# Patient Record
Sex: Male | Born: 1990 | Race: White | Hispanic: No | Marital: Single | Smoking: Never smoker
Health system: Southern US, Community
[De-identification: ages and names within clinical notes are randomized; demographics above are authoritative.]

## PROBLEM LIST (undated history)

## (undated) DIAGNOSIS — L0390 Cellulitis, unspecified: Secondary | ICD-10-CM

---
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
## 2014-04-13 ENCOUNTER — Emergency Department: Payer: Self-pay | Admitting: Emergency Medicine

## 2014-06-12 ENCOUNTER — Emergency Department: Payer: Self-pay | Admitting: Student

## 2015-05-21 ENCOUNTER — Encounter: Payer: Self-pay | Admitting: Emergency Medicine

## 2015-05-21 ENCOUNTER — Emergency Department
Admission: EM | Admit: 2015-05-21 | Discharge: 2015-05-21 | Disposition: A | Discharge status: Home / Self Care | Payer: 59 | Attending: Emergency Medicine | Admitting: Emergency Medicine

## 2015-05-21 DIAGNOSIS — F419 Anxiety disorder, unspecified: Secondary | ICD-10-CM | POA: Insufficient documentation

## 2015-05-21 DIAGNOSIS — F17200 Nicotine dependence, unspecified, uncomplicated: Secondary | ICD-10-CM | POA: Diagnosis not present

## 2015-05-21 DIAGNOSIS — F1020 Alcohol dependence, uncomplicated: Secondary | ICD-10-CM | POA: Insufficient documentation

## 2015-05-21 DIAGNOSIS — R000 Tachycardia, unspecified: Secondary | ICD-10-CM | POA: Insufficient documentation

## 2015-05-21 DIAGNOSIS — F1010 Alcohol abuse, uncomplicated: Secondary | ICD-10-CM

## 2015-05-21 LAB — CBC WITH DIFFERENTIAL/PLATELET
BASOS ABS: 0 10*3/uL (ref 0–0.1)
BASOS PCT: 0 %
Eosinophils Absolute: 0.4 10*3/uL (ref 0–0.7)
Eosinophils Relative: 6 %
HEMATOCRIT: 47 % (ref 40.0–52.0)
HEMOGLOBIN: 16.6 g/dL (ref 13.0–18.0)
Lymphocytes Relative: 35 %
Lymphs Abs: 2.6 10*3/uL (ref 1.0–3.6)
MCH: 33.4 pg (ref 26.0–34.0)
MCHC: 35.4 g/dL (ref 32.0–36.0)
MCV: 94.3 fL (ref 80.0–100.0)
Monocytes Absolute: 0.7 10*3/uL (ref 0.2–1.0)
Monocytes Relative: 10 %
NEUTROS ABS: 3.6 10*3/uL (ref 1.4–6.5)
NEUTROS PCT: 49 %
Platelets: 151 10*3/uL (ref 150–440)
RBC: 4.99 MIL/uL (ref 4.40–5.90)
RDW: 12.8 % (ref 11.5–14.5)
WBC: 7.4 10*3/uL (ref 3.8–10.6)

## 2015-05-21 LAB — COMPREHENSIVE METABOLIC PANEL
ALT: 9 U/L — ABNORMAL LOW (ref 17–63)
ANION GAP: 8 (ref 5–15)
AST: 23 U/L (ref 15–41)
Albumin: 4.3 g/dL (ref 3.5–5.0)
Alkaline Phosphatase: 108 U/L (ref 38–126)
BUN: 9 mg/dL (ref 6–20)
CO2: 27 mmol/L (ref 22–32)
Calcium: 8.7 mg/dL — ABNORMAL LOW (ref 8.9–10.3)
Chloride: 105 mmol/L (ref 101–111)
Creatinine, Ser: 0.66 mg/dL (ref 0.61–1.24)
GFR calc non Af Amer: >60 mL/min (ref >60)
Glucose, Bld: 99 mg/dL (ref 65–99)
Potassium: 3.6 mmol/L (ref 3.5–5.1)
Sodium: 140 mmol/L (ref 135–145)
Total Bilirubin: 0.7 mg/dL (ref 0.3–1.2)
Total Protein: 7.3 g/dL (ref 6.5–8.1)

## 2015-05-21 LAB — ACETAMINOPHEN LEVEL: Acetaminophen (Tylenol), Serum: <10 ug/mL — ABNORMAL LOW (ref 10–30)

## 2015-05-21 LAB — URINE DRUG SCREEN, QUALITATIVE (ARMC ONLY)
AMPHETAMINES, UR SCREEN: NOT DETECTED
BARBITURATES, UR SCREEN: NOT DETECTED
BENZODIAZEPINE, UR SCRN: NOT DETECTED
Cannabinoid 50 Ng, Ur ~~LOC~~: NOT DETECTED
Cocaine Metabolite,Ur ~~LOC~~: NOT DETECTED
MDMA (Ecstasy)Ur Screen: NOT DETECTED
METHADONE SCREEN, URINE: NOT DETECTED
Opiate, Ur Screen: NOT DETECTED
PHENCYCLIDINE (PCP) UR S: NOT DETECTED
Tricyclic, Ur Screen: NOT DETECTED

## 2015-05-21 LAB — SALICYLATE LEVEL

## 2015-05-21 LAB — ETHANOL: Alcohol, Ethyl (B): 263 mg/dL — ABNORMAL HIGH (ref <5)

## 2015-05-21 MED ORDER — LORAZEPAM 2 MG PO TABS: 0.0000 mg | ORAL_TABLET | Freq: Two times a day (BID) | ORAL | Status: DC

## 2015-05-21 MED ORDER — LORAZEPAM 2 MG PO TABS: 0.0000 mg | ORAL_TABLET | Freq: Four times a day (QID) | ORAL | Status: DC

## 2015-05-21 MED ORDER — THIAMINE HCL 100 MG/ML IJ SOLN
100.0000 mg | Freq: Every day | INTRAMUSCULAR | Status: DC
Start: 2015-05-21 — End: 2015-05-21

## 2015-05-21 MED ORDER — VITAMIN B-1 100 MG PO TABS
100.0000 mg | ORAL_TABLET | Freq: Every day | ORAL | Status: DC
  Filled 2015-05-21 (×2): qty 1

## 2015-05-21 MED ORDER — LORAZEPAM 2 MG/ML IJ SOLN: 0.0000 mg | Freq: Four times a day (QID) | INTRAMUSCULAR | Status: DC

## 2015-05-21 MED ORDER — LORAZEPAM 2 MG/ML IJ SOLN: 0.0000 mg | Freq: Two times a day (BID) | INTRAMUSCULAR | Status: DC

## 2015-05-21 MED ORDER — NICOTINE 14 MG/24HR TD PT24
14.0000 mg | MEDICATED_PATCH | Freq: Every day | TRANSDERMAL | Status: DC
  Filled 2015-05-21 (×2): qty 1

## 2015-05-21 MED ORDER — LORAZEPAM 1 MG PO TABS: 1.0000 mg | ORAL_TABLET | ORAL | Status: DC

## 2015-05-21 NOTE — ED Notes (Signed)
Pt brought by friends for anxiety and stress.  Denies si hi.  Denies benig depressed.

## 2015-05-21 NOTE — ED Notes (Signed)
Pt given breakfast tray, sprite and warm blanket. Pt is relaxed and watching TV.

## 2015-05-21 NOTE — ED Provider Notes (Signed)
[facility] Emergency Department Provider Note  ____________________________________________  Time seen: Approximately 10:32 AM  I have reviewed the triage vital signs and the nursing notes.   HISTORY  Chief Complaint Anxiety    HPI Mark Keller is a 25 y.o. male for evaluation of anxiety and alcohol use.  Patient reports friends encouraged himto calm for evaluation as he is been drinking excessive alcohol. He reports he has alcoholism, and this runs in his family. Reports he drinks about 12-18 beers per day and will get the shakes if he stops drinking. He's never had any hallucinations or seizures. He denies any suicidal ideation, thoughts of self-harm, or wanting to harm others. No hallucinations.  Denies being in pain. States his last alcohol was about an hour prior to arrival. [place] he is presently intoxicated. He is interested in obtaining detox treatment today.   History reviewed. No pertinent past medical history.  There are no active problems to display for this patient.   No past surgical history on file.  No current outpatient prescriptions on file.  Allergies Review of patient's allergies indicates no known allergies.  No family history on file.  Social History Social History  Substance Use Topics  . Smoking status: Current Every Day Smoker  . Smokeless tobacco: None  . Alcohol Use: Yes    Review of Systems Constitutional: No fever/chills Eyes: No visual changes. ENT: No sore throat. Cardiovascular: Denies chest pain. Respiratory: Denies shortness of breath. Gastrointestinal: No abdominal pain.  No nausea, no vomiting.  No diarrhea.  No constipation. Genitourinary: Negative for dysuria. Musculoskeletal: Negative for back pain. Skin: Negative for rash. Neurological: Negative for headaches, focal weakness or numbness.  10-point ROS otherwise negative.  ____________________________________________   PHYSICAL  EXAM:  VITAL SIGNS: ED Triage Vitals  Enc Vitals Group     BP 05/21/15 0754 142/88 mmHg     Pulse Rate 05/21/15 0754 127     Resp 05/21/15 0754 24     Temp 05/21/15 0754 98.1 F (36.7 C)     Temp Source 05/21/15 0754 Oral     SpO2 05/21/15 0754 100 %     Weight 05/21/15 0754 165 lb (74.844 kg)     Height 05/21/15 0754  (1.778 m)     Head Cir --      Peak Flow --      Pain Score --      Pain Loc --      Pain Edu? --      Excl. in GC? --    Constitutional: Alert and oriented. Well appearing and in no acute distress. Eyes: Conjunctivae are normal. PERRL. EOMI. Mild lateral gaze nystagmus. Head: Atraumatic. Nose: No congestion/rhinnorhea. Mouth/Throat: Mucous membranes are slightly dry.  Oropharynx non-erythematous. Neck: No stridor.   Cardiovascular: Slightly tachycardic rate, regular rhythm. Grossly normal heart sounds.  Good peripheral circulation. Respiratory: Normal respiratory effort.  No retractions. Lungs CTAB. Gastrointestinal: Soft and nontender. No distention. No abdominal bruits. No CVA tenderness. Musculoskeletal: No lower extremity tenderness nor edema.  No joint effusions. Neurologic:  Normal speech and language. No gross focal neurologic deficits are appreciated. Skin:  Skin is warm, dry and intact. No rash noted. Psychiatric: Mood and affect are slightly anxious. Speech and behavior are normal.  ____________________________________________   LABS (all labs ordered are listed, but only abnormal results are displayed)  Labs Reviewed  COMPREHENSIVE METABOLIC PANEL - Abnormal; Notable for the following:    Calcium 8.7 (*)    ALT  9 (*)    All other components within normal limits  ETHANOL - Abnormal; Notable for the following:    Alcohol, Ethyl (B) 263 (*)    All other components within normal limits  ACETAMINOPHEN LEVEL - Abnormal; Notable for the following:    Acetaminophen (Tylenol), Serum <10 (*)    All other components within normal limits  CBC  WITH DIFFERENTIAL/PLATELET  URINE DRUG SCREEN, QUALITATIVE ([facility] ONLY)  SALICYLATE LEVEL   ____________________________________________  EKG   ____________________________________________  RADIOLOGY   ____________________________________________   PROCEDURES  Procedure(s) performed: None  Critical Care performed: No  ____________________________________________   INITIAL IMPRESSION / ASSESSMENT AND PLAN / ED COURSE  Pertinent labs & imaging results that were available during my care of the patient were reviewed by me and considered in my medical decision making (see chart for details).  Patient percents for concerns of alcohol abuse and anxiety. He reports long history of chronic alcohol use. He is requesting detox today. No signs or symptoms to suggest need for involuntary commitment, suicidal ideation, or risk for harming others. I will place him on alcohol withdrawal treatment and consult TTS.  ----------------------------------------- 11:34 AM on 05/21/2015 -----------------------------------------  The patient reports that he no longer wishes to stay for detox. He is awake alert, ambulatory and does not demonstrate clinical evidence of intoxication. His blood alcohol level was 263 about 2 hours ago. Instructed patient he will not be driving today or while using alcohol. He has a friend coming to pick him up, and plan is for him to follow up with [facility] which she was given referral information for.  Careful return precautions discussed with patient.  Discharging pending arrival of sober driver.  Return precautions and treatment recommendations and follow-up discussed with the patient who is agreeable with the plan.  ____________________________________________   FINAL CLINICAL IMPRESSION(S) / ED DIAGNOSES  Final diagnoses:  Alcohol abuse      Sharyn Creamer, MD 05/21/15 1135
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
## 2015-05-21 NOTE — BH Assessment (Signed)
Discussed patient with ER MD (Dr. Fanny Bien) and patient is able to discharge home when medically cleared. Patient was giving referral information and instructions on how to follow up with Outpatient Treatment [employer]) and [facility].  I advised the patient to call the toll free phone on his insurance card for the counselors and agencies in his network.
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
## 2022-09-28 ENCOUNTER — Emergency Department (HOSPITAL_COMMUNITY): Payer: Medicaid Other

## 2022-09-28 ENCOUNTER — Emergency Department (HOSPITAL_COMMUNITY): Payer: Medicaid Other | Admitting: Certified Registered Nurse Anesthetist

## 2022-09-28 ENCOUNTER — Encounter (HOSPITAL_COMMUNITY)
Admission: EM | Disposition: A | Discharge status: Home / Self Care | Payer: Self-pay | Source: Home / Self Care | Attending: Vascular Surgery

## 2022-09-28 ENCOUNTER — Inpatient Hospital Stay (HOSPITAL_COMMUNITY)
Admission: EM | Admit: 2022-09-28 | Discharge: 2022-10-01 | DRG: 908 | Disposition: A | Discharge status: Home / Self Care | Payer: Medicaid Other | Attending: Vascular Surgery | Admitting: Vascular Surgery

## 2022-09-28 ENCOUNTER — Other Ambulatory Visit: Payer: Self-pay

## 2022-09-28 ENCOUNTER — Encounter (HOSPITAL_COMMUNITY): Payer: Self-pay

## 2022-09-28 DIAGNOSIS — Z9889 Other specified postprocedural states: Secondary | ICD-10-CM

## 2022-09-28 DIAGNOSIS — W3400XA Accidental discharge from unspecified firearms or gun, initial encounter: Principal | ICD-10-CM

## 2022-09-28 DIAGNOSIS — D62 Acute posthemorrhagic anemia: Secondary | ICD-10-CM | POA: Diagnosis not present

## 2022-09-28 DIAGNOSIS — W3409XA Accidental discharge from other specified firearms, initial encounter: Secondary | ICD-10-CM | POA: Diagnosis not present

## 2022-09-28 DIAGNOSIS — S81802A Unspecified open wound, left lower leg, initial encounter: Secondary | ICD-10-CM | POA: Diagnosis not present

## 2022-09-28 DIAGNOSIS — S75092A Other specified injury of femoral artery, left leg, initial encounter: Secondary | ICD-10-CM

## 2022-09-28 DIAGNOSIS — R450 Nervousness: Secondary | ICD-10-CM | POA: Diagnosis not present

## 2022-09-28 DIAGNOSIS — Z743 Need for continuous supervision: Secondary | ICD-10-CM | POA: Diagnosis not present

## 2022-09-28 DIAGNOSIS — I743 Embolism and thrombosis of arteries of the lower extremities: Secondary | ICD-10-CM | POA: Diagnosis not present

## 2022-09-28 DIAGNOSIS — Z23 Encounter for immunization: Secondary | ICD-10-CM | POA: Diagnosis not present

## 2022-09-28 DIAGNOSIS — S75002A Unspecified injury of femoral artery, left leg, initial encounter: Secondary | ICD-10-CM | POA: Diagnosis not present

## 2022-09-28 DIAGNOSIS — S71132A Puncture wound without foreign body, left thigh, initial encounter: Secondary | ICD-10-CM | POA: Diagnosis not present

## 2022-09-28 DIAGNOSIS — S75022A Major laceration of femoral artery, left leg, initial encounter: Principal | ICD-10-CM | POA: Diagnosis present

## 2022-09-28 DIAGNOSIS — Z885 Allergy status to narcotic agent status: Secondary | ICD-10-CM | POA: Diagnosis not present

## 2022-09-28 DIAGNOSIS — I959 Hypotension, unspecified: Secondary | ICD-10-CM | POA: Diagnosis not present

## 2022-09-28 DIAGNOSIS — R42 Dizziness and giddiness: Secondary | ICD-10-CM | POA: Diagnosis not present

## 2022-09-28 DIAGNOSIS — Y249XXA Unspecified firearm discharge, undetermined intent, initial encounter: Secondary | ICD-10-CM

## 2022-09-28 DIAGNOSIS — Y9389 Activity, other specified: Secondary | ICD-10-CM

## 2022-09-28 DIAGNOSIS — E876 Hypokalemia: Secondary | ICD-10-CM | POA: Diagnosis present

## 2022-09-28 DIAGNOSIS — S71102A Unspecified open wound, left thigh, initial encounter: Secondary | ICD-10-CM | POA: Diagnosis not present

## 2022-09-28 HISTORY — PX: FEMORAL-POPLITEAL BYPASS GRAFT: SHX937

## 2022-09-28 HISTORY — PX: VEIN HARVEST: SHX6363

## 2022-09-28 HISTORY — PX: FASCIOTOMY: SHX132

## 2022-09-28 HISTORY — PX: PATCH ANGIOPLASTY: SHX6230

## 2022-09-28 LAB — CBC
HCT: 39.7 % (ref 39.0–52.0)
Hemoglobin: 13.2 g/dL (ref 13.0–17.0)
MCH: 31.1 pg (ref 26.0–34.0)
MCHC: 33.2 g/dL (ref 30.0–36.0)
MCV: 93.6 fL (ref 80.0–100.0)
Platelets: 230 10*3/uL (ref 150–400)
RBC: 4.24 MIL/uL (ref 4.22–5.81)
RDW: 12.9 % (ref 11.5–15.5)
WBC: 16.8 10*3/uL — ABNORMAL HIGH (ref 4.0–10.5)
nRBC: 0 % (ref 0.0–0.2)

## 2022-09-28 LAB — PROTIME-INR
INR: 1 (ref 0.8–1.2)
Prothrombin Time: 13.7 seconds (ref 11.4–15.2)

## 2022-09-28 LAB — I-STAT CHEM 8, ED
BUN: 19 mg/dL (ref 6–20)
Calcium, Ion: 1.11 mmol/L — ABNORMAL LOW (ref 1.15–1.40)
Chloride: 102 mmol/L (ref 98–111)
Creatinine, Ser: 1.1 mg/dL (ref 0.61–1.24)
Glucose, Bld: 180 mg/dL — ABNORMAL HIGH (ref 70–99)
HCT: 40 % (ref 39.0–52.0)
Hemoglobin: 13.6 g/dL (ref 13.0–17.0)
Potassium: 2.8 mmol/L — ABNORMAL LOW (ref 3.5–5.1)
Sodium: 138 mmol/L (ref 135–145)
TCO2: 20 mmol/L — ABNORMAL LOW (ref 22–32)

## 2022-09-28 LAB — POCT I-STAT 7, (LYTES, BLD GAS, ICA,H+H)
Acid-base deficit: 5 mmol/L — ABNORMAL HIGH (ref 0.0–2.0)
Acid-base deficit: 7 mmol/L — ABNORMAL HIGH (ref 0.0–2.0)
Bicarbonate: 21.8 mmol/L (ref 20.0–28.0)
Bicarbonate: 19.5 mmol/L — ABNORMAL LOW (ref 20.0–28.0)
Calcium, Ion: 1.1 mmol/L — ABNORMAL LOW (ref 1.15–1.40)
Calcium, Ion: 1.04 mmol/L — ABNORMAL LOW (ref 1.15–1.40)
HCT: 32 % — ABNORMAL LOW (ref 39.0–52.0)
HCT: 31 % — ABNORMAL LOW (ref 39.0–52.0)
Hemoglobin: 10.9 g/dL — ABNORMAL LOW (ref 13.0–17.0)
Hemoglobin: 10.5 g/dL — ABNORMAL LOW (ref 13.0–17.0)
O2 Saturation: 100 %
O2 Saturation: 100 %
Patient temperature: 35.3
Patient temperature: 35.3
Potassium: 4.1 mmol/L (ref 3.5–5.1)
Potassium: 4.5 mmol/L (ref 3.5–5.1)
Sodium: 137 mmol/L (ref 135–145)
Sodium: 138 mmol/L (ref 135–145)
TCO2: 23 mmol/L (ref 22–32)
TCO2: 21 mmol/L — ABNORMAL LOW (ref 22–32)
pCO2 arterial: 41.7 mmHg (ref 32–48)
pCO2 arterial: 37.9 mmHg (ref 32–48)
pH, Arterial: 7.318 — ABNORMAL LOW (ref 7.35–7.45)
pH, Arterial: 7.312 — ABNORMAL LOW (ref 7.35–7.45)
pO2, Arterial: 349 mmHg — ABNORMAL HIGH (ref 83–108)
pO2, Arterial: 333 mmHg — ABNORMAL HIGH (ref 83–108)

## 2022-09-28 LAB — COMPREHENSIVE METABOLIC PANEL
ALT: 12 U/L (ref 0–44)
AST: 23 U/L (ref 15–41)
Albumin: 3.9 g/dL (ref 3.5–5.0)
Alkaline Phosphatase: 59 U/L (ref 38–126)
Anion gap: 14 (ref 5–15)
BUN: 17 mg/dL (ref 6–20)
CO2: 19 mmol/L — ABNORMAL LOW (ref 22–32)
Calcium: 8.6 mg/dL — ABNORMAL LOW (ref 8.9–10.3)
Chloride: 100 mmol/L (ref 98–111)
Creatinine, Ser: 1.14 mg/dL (ref 0.61–1.24)
GFR, Estimated: >60 mL/min (ref >60)
Glucose, Bld: 188 mg/dL — ABNORMAL HIGH (ref 70–99)
Potassium: 2.7 mmol/L — CL (ref 3.5–5.1)
Sodium: 133 mmol/L — ABNORMAL LOW (ref 135–145)
Total Bilirubin: 0.4 mg/dL (ref 0.3–1.2)
Total Protein: 6.1 g/dL — ABNORMAL LOW (ref 6.5–8.1)

## 2022-09-28 LAB — POCT ACTIVATED CLOTTING TIME
Activated Clotting Time: 195 s
Activated Clotting Time: 207 s

## 2022-09-28 LAB — PREPARE RBC (CROSSMATCH)

## 2022-09-28 LAB — ETHANOL: Alcohol, Ethyl (B): <10 mg/dL (ref <10)

## 2022-09-28 LAB — LACTIC ACID, PLASMA: Lactic Acid, Venous: 5.4 mmol/L (ref 0.5–1.9)

## 2022-09-28 LAB — ABO/RH: ABO/RH(D): A POS

## 2022-09-28 SURGERY — BYPASS GRAFT FEMORAL-POPLITEAL ARTERY
Anesthesia: General | Site: Leg Upper | Laterality: Right

## 2022-09-28 MED ORDER — SUCCINYLCHOLINE CHLORIDE 200 MG/10ML IV SOSY
PREFILLED_SYRINGE | INTRAVENOUS | Status: DC | PRN
  Administered 2022-09-28: 160 mg via INTRAVENOUS

## 2022-09-28 MED ORDER — HEPARIN SODIUM (PORCINE) 5000 UNIT/ML IJ SOLN
5000.0000 [IU] | Freq: Three times a day (TID) | INTRAMUSCULAR | Status: DC
  Administered 2022-09-29 – 2022-10-01 (×6): 5000 [IU] via SUBCUTANEOUS
  Filled 2022-09-28 (×6): qty 1

## 2022-09-28 MED ORDER — DEXMEDETOMIDINE HCL IN NACL 80 MCG/20ML IV SOLN
INTRAVENOUS | Status: AC
  Filled 2022-09-28: qty 20

## 2022-09-28 MED ORDER — HYDROMORPHONE HCL 1 MG/ML IJ SOLN: 0.2500 mg | INTRAMUSCULAR | Status: DC | PRN

## 2022-09-28 MED ORDER — BISACODYL 5 MG PO TBEC: 5.0000 mg | DELAYED_RELEASE_TABLET | Freq: Every day | ORAL | Status: DC | PRN

## 2022-09-28 MED ORDER — HEMOSTATIC AGENTS (NO CHARGE) OPTIME
TOPICAL | Status: DC | PRN
  Administered 2022-09-28 (×2): 1 via TOPICAL

## 2022-09-28 MED ORDER — LACTATED RINGERS IV BOLUS
1000.0000 mL | Freq: Once | INTRAVENOUS | Status: AC
  Administered 2022-09-28: 1000 mL via INTRAVENOUS

## 2022-09-28 MED ORDER — POTASSIUM CHLORIDE CRYS ER 20 MEQ PO TBCR: 20.0000 meq | EXTENDED_RELEASE_TABLET | Freq: Every day | ORAL | Status: DC | PRN

## 2022-09-28 MED ORDER — PANTOPRAZOLE SODIUM 40 MG PO TBEC
40.0000 mg | DELAYED_RELEASE_TABLET | Freq: Every day | ORAL | Status: DC
  Administered 2022-09-28 – 2022-10-01 (×3): 40 mg via ORAL
  Filled 2022-09-28 (×3): qty 1

## 2022-09-28 MED ORDER — PHENOL 1.4 % MT LIQD: 1.0000 | OROMUCOSAL | Status: DC | PRN

## 2022-09-28 MED ORDER — CEFAZOLIN SODIUM 1 G IJ SOLR
INTRAMUSCULAR | Status: AC
  Filled 2022-09-28: qty 20

## 2022-09-28 MED ORDER — PHENYLEPHRINE HCL-NACL 20-0.9 MG/250ML-% IV SOLN
INTRAVENOUS | Status: DC | PRN
  Administered 2022-09-28: 60 ug/min via INTRAVENOUS

## 2022-09-28 MED ORDER — HYDROMORPHONE HCL 1 MG/ML IJ SOLN
1.0000 mg | INTRAMUSCULAR | Status: DC | PRN
  Administered 2022-09-28 – 2022-10-01 (×2): 1 mg via INTRAVENOUS
  Filled 2022-09-28 (×2): qty 1

## 2022-09-28 MED ORDER — 0.9 % SODIUM CHLORIDE (POUR BTL) OPTIME
TOPICAL | Status: DC | PRN
  Administered 2022-09-28: 2000 mL

## 2022-09-28 MED ORDER — HEPARIN SODIUM (PORCINE) 1000 UNIT/ML IJ SOLN
INTRAMUSCULAR | Status: DC | PRN
  Administered 2022-09-28: 7000 [IU] via INTRAVENOUS
  Administered 2022-09-28: 2000 [IU] via INTRAVENOUS

## 2022-09-28 MED ORDER — SODIUM CHLORIDE 0.9 % IV SOLN: INTRAVENOUS | Status: DC | PRN

## 2022-09-28 MED ORDER — PROTAMINE SULFATE 10 MG/ML IV SOLN
INTRAVENOUS | Status: AC
  Filled 2022-09-28: qty 10

## 2022-09-28 MED ORDER — SODIUM CHLORIDE 0.9 % IV SOLN: 500.0000 mL | Freq: Once | INTRAVENOUS | Status: DC | PRN

## 2022-09-28 MED ORDER — LIDOCAINE 2% (20 MG/ML) 5 ML SYRINGE
INTRAMUSCULAR | Status: AC
  Filled 2022-09-28: qty 5

## 2022-09-28 MED ORDER — ONDANSETRON HCL 4 MG/2ML IJ SOLN
4.0000 mg | Freq: Four times a day (QID) | INTRAMUSCULAR | Status: DC | PRN
  Administered 2022-09-28 – 2022-10-01 (×3): 4 mg via INTRAVENOUS
  Filled 2022-09-28 (×2): qty 2

## 2022-09-28 MED ORDER — DEXMEDETOMIDINE HCL IN NACL 80 MCG/20ML IV SOLN
INTRAVENOUS | Status: DC | PRN
  Administered 2022-09-28: 4 ug via INTRAVENOUS
  Administered 2022-09-28: 8 ug via INTRAVENOUS

## 2022-09-28 MED ORDER — AMISULPRIDE (ANTIEMETIC) 5 MG/2ML IV SOLN
INTRAVENOUS | Status: AC
  Filled 2022-09-28: qty 4

## 2022-09-28 MED ORDER — ASPIRIN 81 MG PO TBEC
81.0000 mg | DELAYED_RELEASE_TABLET | Freq: Every day | ORAL | Status: DC
  Administered 2022-09-29 – 2022-10-01 (×2): 81 mg via ORAL
  Filled 2022-09-28 (×2): qty 1

## 2022-09-28 MED ORDER — ALUM & MAG HYDROXIDE-SIMETH 200-200-20 MG/5ML PO SUSP: 15.0000 mL | ORAL | Status: DC | PRN

## 2022-09-28 MED ORDER — MIDAZOLAM HCL 5 MG/5ML IJ SOLN
INTRAMUSCULAR | Status: DC | PRN
  Administered 2022-09-28: 2 mg via INTRAVENOUS

## 2022-09-28 MED ORDER — PROPOFOL 10 MG/ML IV BOLUS
INTRAVENOUS | Status: DC | PRN
  Administered 2022-09-28: 120 mg via INTRAVENOUS

## 2022-09-28 MED ORDER — FENTANYL CITRATE (PF) 100 MCG/2ML IJ SOLN
INTRAMUSCULAR | Status: DC | PRN
  Administered 2022-09-28 (×2): 100 ug via INTRAVENOUS
  Administered 2022-09-28 (×2): 50 ug via INTRAVENOUS
  Administered 2022-09-28: 100 ug via INTRAVENOUS
  Administered 2022-09-28: 50 ug via INTRAVENOUS

## 2022-09-28 MED ORDER — PROTAMINE SULFATE 10 MG/ML IV SOLN
INTRAVENOUS | Status: DC | PRN
  Administered 2022-09-28: 10 mg via INTRAVENOUS
  Administered 2022-09-28 (×2): 20 mg via INTRAVENOUS

## 2022-09-28 MED ORDER — ACETAMINOPHEN 650 MG RE SUPP: 325.0000 mg | RECTAL | Status: DC | PRN

## 2022-09-28 MED ORDER — FENTANYL CITRATE PF 50 MCG/ML IJ SOSY
100.0000 ug | PREFILLED_SYRINGE | Freq: Once | INTRAMUSCULAR | Status: AC
  Administered 2022-09-28: 100 ug via INTRAVENOUS

## 2022-09-28 MED ORDER — IOHEXOL 350 MG/ML SOLN
100.0000 mL | Freq: Once | INTRAVENOUS | Status: AC | PRN
  Administered 2022-09-28: 100 mL via INTRAVENOUS

## 2022-09-28 MED ORDER — SODIUM CHLORIDE 0.9 % IV SOLN: INTRAVENOUS | Status: DC

## 2022-09-28 MED ORDER — HEPARIN SODIUM (PORCINE) 1000 UNIT/ML IJ SOLN
INTRAMUSCULAR | Status: AC
  Filled 2022-09-28: qty 20

## 2022-09-28 MED ORDER — SENNOSIDES-DOCUSATE SODIUM 8.6-50 MG PO TABS
1.0000 | ORAL_TABLET | Freq: Every evening | ORAL | Status: DC | PRN
  Administered 2022-09-30: 1 via ORAL
  Filled 2022-09-28: qty 1

## 2022-09-28 MED ORDER — LACTATED RINGERS IV SOLN: INTRAVENOUS | Status: DC | PRN

## 2022-09-28 MED ORDER — HYDRALAZINE HCL 20 MG/ML IJ SOLN: 5.0000 mg | INTRAMUSCULAR | Status: DC | PRN

## 2022-09-28 MED ORDER — CEFAZOLIN SODIUM-DEXTROSE 2-4 GM/100ML-% IV SOLN
2.0000 g | Freq: Once | INTRAVENOUS | Status: AC
  Administered 2022-09-28: 2 g via INTRAVENOUS
  Filled 2022-09-28: qty 100

## 2022-09-28 MED ORDER — MAGNESIUM SULFATE 2 GM/50ML IV SOLN: 2.0000 g | Freq: Every day | INTRAVENOUS | Status: DC | PRN

## 2022-09-28 MED ORDER — ACETAMINOPHEN 325 MG PO TABS: 325.0000 mg | ORAL_TABLET | ORAL | Status: DC | PRN

## 2022-09-28 MED ORDER — GUAIFENESIN-DM 100-10 MG/5ML PO SYRP: 15.0000 mL | ORAL_SOLUTION | ORAL | Status: DC | PRN

## 2022-09-28 MED ORDER — MIDAZOLAM HCL 2 MG/2ML IJ SOLN
INTRAMUSCULAR | Status: AC
  Filled 2022-09-28: qty 2

## 2022-09-28 MED ORDER — CEFAZOLIN SODIUM-DEXTROSE 2-3 GM-%(50ML) IV SOLR
INTRAVENOUS | Status: DC | PRN
  Administered 2022-09-28: 2 g via INTRAVENOUS

## 2022-09-28 MED ORDER — OXYCODONE HCL 5 MG PO TABS: 5.0000 mg | ORAL_TABLET | Freq: Once | ORAL | Status: DC | PRN

## 2022-09-28 MED ORDER — FENTANYL CITRATE (PF) 250 MCG/5ML IJ SOLN
INTRAMUSCULAR | Status: AC
  Filled 2022-09-28: qty 5

## 2022-09-28 MED ORDER — FLEET ENEMA 7-19 GM/118ML RE ENEM: 1.0000 | ENEMA | Freq: Once | RECTAL | Status: DC | PRN

## 2022-09-28 MED ORDER — ROCURONIUM BROMIDE 10 MG/ML (PF) SYRINGE
PREFILLED_SYRINGE | INTRAVENOUS | Status: AC
  Filled 2022-09-28: qty 10

## 2022-09-28 MED ORDER — OXYCODONE HCL 5 MG/5ML PO SOLN: 5.0000 mg | Freq: Once | ORAL | Status: DC | PRN

## 2022-09-28 MED ORDER — OXYCODONE-ACETAMINOPHEN 5-325 MG PO TABS
1.0000 | ORAL_TABLET | ORAL | Status: DC | PRN
  Administered 2022-09-28: 1 via ORAL
  Administered 2022-09-28 – 2022-10-01 (×11): 2 via ORAL
  Filled 2022-09-28: qty 1
  Filled 2022-09-28 (×10): qty 2

## 2022-09-28 MED ORDER — ALBUMIN HUMAN 5 % IV SOLN: INTRAVENOUS | Status: DC | PRN

## 2022-09-28 MED ORDER — FENTANYL CITRATE PF 50 MCG/ML IJ SOSY
PREFILLED_SYRINGE | INTRAMUSCULAR | Status: AC
  Filled 2022-09-28: qty 2

## 2022-09-28 MED ORDER — POTASSIUM CHLORIDE 10 MEQ/100ML IV SOLN
10.0000 meq | INTRAVENOUS | Status: AC
  Administered 2022-09-28 (×2): 10 meq via INTRAVENOUS
  Filled 2022-09-28 (×2): qty 100

## 2022-09-28 MED ORDER — LIDOCAINE HCL (CARDIAC) PF 100 MG/5ML IV SOSY
PREFILLED_SYRINGE | INTRAVENOUS | Status: DC | PRN
  Administered 2022-09-28: 40 mg via INTRAVENOUS

## 2022-09-28 MED ORDER — HEPARIN 6000 UNIT IRRIGATION SOLUTION
Status: DC | PRN
  Administered 2022-09-28: 1

## 2022-09-28 MED ORDER — MIDAZOLAM HCL 2 MG/2ML IJ SOLN: 0.5000 mg | Freq: Once | INTRAMUSCULAR | Status: DC | PRN

## 2022-09-28 MED ORDER — METOPROLOL TARTRATE 5 MG/5ML IV SOLN: 2.0000 mg | INTRAVENOUS | Status: DC | PRN

## 2022-09-28 MED ORDER — ONDANSETRON HCL 4 MG/2ML IJ SOLN
INTRAMUSCULAR | Status: DC | PRN
  Administered 2022-09-28: 4 mg via INTRAVENOUS

## 2022-09-28 MED ORDER — SUGAMMADEX SODIUM 200 MG/2ML IV SOLN
INTRAVENOUS | Status: DC | PRN
  Administered 2022-09-28: 150 mg via INTRAVENOUS

## 2022-09-28 MED ORDER — ONDANSETRON HCL 4 MG/2ML IJ SOLN
INTRAMUSCULAR | Status: AC
  Filled 2022-09-28: qty 2

## 2022-09-28 MED ORDER — PHENYLEPHRINE 80 MCG/ML (10ML) SYRINGE FOR IV PUSH (FOR BLOOD PRESSURE SUPPORT)
PREFILLED_SYRINGE | INTRAVENOUS | Status: AC
  Filled 2022-09-28: qty 10

## 2022-09-28 MED ORDER — PHENYLEPHRINE HCL (PRESSORS) 10 MG/ML IV SOLN
INTRAVENOUS | Status: DC | PRN
  Administered 2022-09-28 (×3): 160 ug via INTRAVENOUS

## 2022-09-28 MED ORDER — AMISULPRIDE (ANTIEMETIC) 5 MG/2ML IV SOLN
10.0000 mg | Freq: Once | INTRAVENOUS | Status: AC | PRN
  Administered 2022-09-28: 10 mg via INTRAVENOUS

## 2022-09-28 MED ORDER — SODIUM CHLORIDE 0.9% IV SOLUTION: Freq: Once | INTRAVENOUS | Status: DC

## 2022-09-28 MED ORDER — ONDANSETRON HCL 4 MG/2ML IJ SOLN
INTRAMUSCULAR | Status: AC
  Administered 2022-09-28: 4 mg
  Filled 2022-09-28: qty 2

## 2022-09-28 MED ORDER — DOCUSATE SODIUM 100 MG PO CAPS
100.0000 mg | ORAL_CAPSULE | Freq: Every day | ORAL | Status: DC
  Administered 2022-09-29 – 2022-10-01 (×2): 100 mg via ORAL
  Filled 2022-09-28 (×2): qty 1

## 2022-09-28 MED ORDER — TETANUS-DIPHTH-ACELL PERTUSSIS 5-2.5-18.5 LF-MCG/0.5 IM SUSY
0.5000 mL | PREFILLED_SYRINGE | Freq: Once | INTRAMUSCULAR | Status: AC
  Administered 2022-09-28: 0.5 mL via INTRAMUSCULAR
  Filled 2022-09-28: qty 0.5

## 2022-09-28 MED ORDER — ROCURONIUM BROMIDE 100 MG/10ML IV SOLN
INTRAVENOUS | Status: DC | PRN
  Administered 2022-09-28: 70 mg via INTRAVENOUS
  Administered 2022-09-28: 15 mg via INTRAVENOUS

## 2022-09-28 MED ORDER — CEFAZOLIN SODIUM-DEXTROSE 2-4 GM/100ML-% IV SOLN
2.0000 g | Freq: Three times a day (TID) | INTRAVENOUS | Status: AC
  Administered 2022-09-28 (×2): 2 g via INTRAVENOUS
  Filled 2022-09-28 (×2): qty 100

## 2022-09-28 MED ORDER — LABETALOL HCL 5 MG/ML IV SOLN: 10.0000 mg | INTRAVENOUS | Status: DC | PRN

## 2022-09-28 MED ORDER — SUCCINYLCHOLINE CHLORIDE 200 MG/10ML IV SOSY
PREFILLED_SYRINGE | INTRAVENOUS | Status: AC
  Filled 2022-09-28: qty 10

## 2022-09-28 MED ORDER — MEPERIDINE HCL 25 MG/ML IJ SOLN: 6.2500 mg | INTRAMUSCULAR | Status: DC | PRN

## 2022-09-28 SURGICAL SUPPLY — 57 items
ADH SKN CLS APL DERMABOND .7 (GAUZE/BANDAGES/DRESSINGS) ×3
AGENT HMST KT MTR STRL THRMB (HEMOSTASIS) ×3
BAG COUNTER SPONGE SURGICOUNT (BAG) ×3 IMPLANT
BAG SPNG CNTER NS LX DISP (BAG) ×3
BANDAGE ESMARK 6X9 LF (GAUZE/BANDAGES/DRESSINGS) IMPLANT
BLADE CLIPPER SURG (BLADE) ×3 IMPLANT
BNDG CMPR 9X6 STRL LF SNTH (GAUZE/BANDAGES/DRESSINGS)
BNDG ESMARK 6X9 LF (GAUZE/BANDAGES/DRESSINGS)
CANISTER SUCT 3000ML PPV (MISCELLANEOUS) ×3 IMPLANT
CATH EMB 3FR 80 (CATHETERS) IMPLANT
CLIP FOGARTY SPRING 6M (CLIP) IMPLANT
CLIP LIGATING EXTRA MED SLVR (CLIP) IMPLANT
CLIP LIGATING EXTRA SM BLUE (MISCELLANEOUS) IMPLANT
CLIP TI MEDIUM 24 (CLIP) ×3 IMPLANT
CLIP TI WIDE RED SMALL 24 (CLIP) ×3 IMPLANT
COVER PROBE W GEL 5X96 (DRAPES) ×3 IMPLANT
DERMABOND ADVANCED .7 DNX12 (GAUZE/BANDAGES/DRESSINGS) ×3 IMPLANT
DRAIN CHANNEL 15F RND FF W/TCR (WOUND CARE) IMPLANT
DRAPE C-ARM 42X72 X-RAY (DRAPES) IMPLANT
ELECT REM PT RETURN 9FT ADLT (ELECTROSURGICAL) ×3
ELECTRODE REM PT RTRN 9FT ADLT (ELECTROSURGICAL) ×3 IMPLANT
EVACUATOR SILICONE 100CC (DRAIN) IMPLANT
GLOVE BIOGEL PI IND STRL 8 (GLOVE) ×3 IMPLANT
GOWN STRL REUS W/ TWL LRG LVL3 (GOWN DISPOSABLE) ×6 IMPLANT
GOWN STRL REUS W/TWL 2XL LVL3 (GOWN DISPOSABLE) ×6 IMPLANT
GOWN STRL REUS W/TWL LRG LVL3 (GOWN DISPOSABLE) ×6
HEMOSTAT SNOW SURGICEL 2X4 (HEMOSTASIS) IMPLANT
INSERT FOGARTY SM (MISCELLANEOUS) IMPLANT
KIT BASIN OR (CUSTOM PROCEDURE TRAY) ×3 IMPLANT
KIT TURNOVER KIT B (KITS) ×3 IMPLANT
NS IRRIG 1000ML POUR BTL (IV SOLUTION) ×6 IMPLANT
PACK PERIPHERAL VASCULAR (CUSTOM PROCEDURE TRAY) ×3 IMPLANT
PAD ARMBOARD 7.5X6 YLW CONV (MISCELLANEOUS) ×6 IMPLANT
SET MICROPUNCTURE 5F STIFF (MISCELLANEOUS) IMPLANT
SPONGE T-LAP 18X18 ~~LOC~~+RFID (SPONGE) IMPLANT
STAPLER VISISTAT 35W (STAPLE) IMPLANT
STOPCOCK 4 WAY LG BORE MALE ST (IV SETS) IMPLANT
SURGIFLO W/THROMBIN 8M KIT (HEMOSTASIS) IMPLANT
SUT ETHILON 3 0 PS 1 (SUTURE) IMPLANT
SUT MNCRL AB 4-0 PS2 18 (SUTURE) ×9 IMPLANT
SUT PROLENE 5 0 C 1 24 (SUTURE) ×3 IMPLANT
SUT PROLENE 6 0 BV (SUTURE) ×3 IMPLANT
SUT PROLENE 7 0 BV 1 (SUTURE) IMPLANT
SUT SILK 2 0 PERMA HAND 18 BK (SUTURE) IMPLANT
SUT SILK 3 0 (SUTURE)
SUT SILK 3-0 18XBRD TIE 12 (SUTURE) IMPLANT
SUT VIC AB 2-0 CT1 27 (SUTURE) ×12
SUT VIC AB 2-0 CT1 TAPERPNT 27 (SUTURE) ×6 IMPLANT
SUT VIC AB 3-0 SH 27 (SUTURE) ×3
SUT VIC AB 3-0 SH 27X BRD (SUTURE) ×9 IMPLANT
SYR 3ML LL SCALE MARK (SYRINGE) IMPLANT
TAPE UMBILICAL 1/8X30 (MISCELLANEOUS) IMPLANT
TOWEL GREEN STERILE (TOWEL DISPOSABLE) ×3 IMPLANT
TRAY FOLEY MTR SLVR 16FR STAT (SET/KITS/TRAYS/PACK) ×3 IMPLANT
TUBING EXTENTION W/L.L. (IV SETS) IMPLANT
UNDERPAD 30X36 HEAVY ABSORB (UNDERPADS AND DIAPERS) ×3 IMPLANT
WATER STERILE IRR 1000ML POUR (IV SOLUTION) ×3 IMPLANT
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
## 2022-09-28 NOTE — Progress Notes (Signed)
Pt admitted to room 7 from PACU. CHG wipe given. Initiated tele. VSS. Call bell within reach.   Lawson Radar, RN
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
## 2022-09-28 NOTE — Anesthesia Procedure Notes (Signed)
Arterial Line Insertion Start/End7/11/2022 4:53 AM, 09/28/2022 4:59 AM Performed by: Jairo Ben, MD, anesthesiologist  Patient location: OR. Preanesthetic checklist: patient identified, IV checked, site marked, risks and benefits discussed, surgical consent, monitors and equipment checked, pre-op evaluation, timeout performed and anesthesia consent Left, radial was placed Catheter size: 20 G Hand hygiene performed , maximum sterile barriers used  and Seldinger technique used  Attempts: 2 Procedure performed without using ultrasound guided technique. Following insertion, dressing applied and Biopatch. Post procedure assessment: normal  Patient tolerated the procedure well with no immediate complications.

## 2022-09-28 NOTE — Progress Notes (Signed)
Orthopedic Tech Progress Note Patient Details:  CODEN Basehor Apr 08, 1990 [id number]  Patient ID: Mark Keller, male   DOB: 14-Dec-1990, 32 y.o.   MRN: [id number] I attended trauma page. Trinna Post 09/28/2022, 6:26 AM
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
## 2022-09-28 NOTE — TOC CAGE-AID Note (Signed)
Transition of Care [facility]) - CAGE-AID Screening   Patient Details  Name: Mark Keller MRN: [id number] Date of Birth: July 10, 1990  Transition of [facility]) CM/SW Contact:    Leota Sauers, RN Phone Number: 09/28/2022, 6:51 PM   Clinical Narrative:  Patient endorses occasional use of alcohol and THC. Denies needs for resources.  CAGE-AID Screening:    Have You Ever Felt You Ought to Cut Down on Your Drinking or Drug Use?: No Have People Annoyed You By Critizing Your Drinking Or Drug Use?: No Have You Felt Bad Or Guilty About Your Drinking Or Drug Use?: No Have You Ever Had a Drink or Used Drugs First Thing In The Morning to Steady Your Nerves or to Get Rid of a Hangover?: No CAGE-AID Score: 0  Substance Abuse Education Offered: No
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
## 2022-09-28 NOTE — Anesthesia Postprocedure Evaluation (Signed)
Anesthesia Post Note  Patient: Mark Keller  Procedure(s) Performed: LEFT SUPERFICIAL FEMORAL ARTERY REVASCULARIZATION (Left: Leg Upper) RIGHT GREATER SAPHENOUS VEIN HARVEST (Right: Leg Upper) LEFT SUPERFICIAL FEMORAL ARTERY PATCH ANGIOPLASTY USING VEIN (Left: Leg Upper) LEFT LOWER LEG FOUR COMPARTMENT FASCIOTOMY, LEFT THIGH COMPARTMENT FASCIOTOMY (Left: Leg Lower)     Patient location during evaluation: PACU Anesthesia Type: General Level of consciousness: awake and alert Pain management: pain level controlled Vital Signs Assessment: post-procedure vital signs reviewed and stable Respiratory status: spontaneous breathing, nonlabored ventilation and respiratory function stable Cardiovascular status: blood pressure returned to baseline and stable Postop Assessment: no apparent nausea or vomiting Anesthetic complications: no  No notable events documented.  Last Vitals:  Vitals:   09/28/22 0830 09/28/22 0843  BP: (!) 131/90 117/74  Pulse: 78 76  Resp:    Temp:  36.8 C  SpO2: 100% 99%    Last Pain:  Vitals:   09/28/22 0745  TempSrc:   PainSc: 0-No pain                 Judy Pollman,W. EDMOND
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
## 2022-09-28 NOTE — ED Notes (Signed)
Please notify Deputy Roger Shelter [phone] if condition changes.

## 2022-09-28 NOTE — Transfer of Care (Signed)
Immediate Anesthesia Transfer of Care Note  Patient: Mark Keller  Procedure(s) Performed: LEFT SUPERFICIAL FEMORAL ARTERY REVASCULARIZATION (Left: Leg Upper) RIGHT GREATER SAPHENOUS VEIN HARVEST (Right: Leg Upper) LEFT SUPERFICIAL FEMORAL ARTERY PATCH ANGIOPLASTY USING VEIN (Left: Leg Upper) LEFT LOWER LEG FOUR COMPARTMENT FASCIOTOMY, LEFT THIGH COMPARTMENT FASCIOTOMY (Left: Leg Lower)  Patient Location: PACU  Anesthesia Type:General  Level of Consciousness: awake, alert , and oriented  Airway & Oxygen Therapy: Patient Spontanous Breathing  Post-op Assessment: Report given to RN  Post vital signs: Reviewed and stable  Last Vitals:  Vitals Value Taken Time  BP 124/63 09/28/22 0722  Temp    Pulse 90 09/28/22 0726  Resp 17   SpO2 100 % 09/28/22 0726  Vitals shown include unvalidated device data.  Last Pain:  Vitals:   09/28/22 0427  TempSrc:   PainSc: 6       Patients Stated Pain Goal: 0 (09/28/22 0427)  Complications: No notable events documented.

## 2022-09-28 NOTE — ED Notes (Signed)
Patient transported to CT with RN, TRN, and Trauma MD

## 2022-09-28 NOTE — Anesthesia Procedure Notes (Signed)
Procedure Name: Intubation Date/Time: 09/28/2022 4:46 AM  Performed by: Jonatan Wilsey T, CRNAPre-anesthesia Checklist: Patient identified, Emergency Drugs available, Suction available and Patient being monitored Patient Re-evaluated:Patient Re-evaluated prior to induction Oxygen Delivery Method: Circle system utilized Preoxygenation: Pre-oxygenation with 100% oxygen Induction Type: IV induction, Rapid sequence and Cricoid Pressure applied Ventilation: Mask ventilation without difficulty Laryngoscope Size: Maddocks and 3 Grade View: Grade I Tube type: Oral Tube size: 7.5 mm Number of attempts: 1 Airway Equipment and Method: Stylet and Oral airway Placement Confirmation: ETT inserted through vocal cords under direct vision, positive ETCO2 and breath sounds checked- equal and bilateral Secured at: 23 cm Tube secured with: Tape Dental Injury: Teeth and Oropharynx as per pre-operative assessment

## 2022-09-29 ENCOUNTER — Encounter (HOSPITAL_COMMUNITY): Payer: Self-pay | Admitting: Vascular Surgery

## 2022-09-29 DIAGNOSIS — S71132A Puncture wound without foreign body, left thigh, initial encounter: Secondary | ICD-10-CM | POA: Diagnosis not present

## 2022-09-29 DIAGNOSIS — S75092A Other specified injury of femoral artery, left leg, initial encounter: Secondary | ICD-10-CM | POA: Diagnosis not present

## 2022-09-29 DIAGNOSIS — D62 Acute posthemorrhagic anemia: Secondary | ICD-10-CM | POA: Diagnosis not present

## 2022-09-29 DIAGNOSIS — W3409XA Accidental discharge from other specified firearms, initial encounter: Secondary | ICD-10-CM | POA: Diagnosis not present

## 2022-09-29 LAB — CBC
HCT: 28.1 % — ABNORMAL LOW (ref 39.0–52.0)
Hemoglobin: 9.7 g/dL — ABNORMAL LOW (ref 13.0–17.0)
MCH: 31.6 pg (ref 26.0–34.0)
MCHC: 34.5 g/dL (ref 30.0–36.0)
MCV: 91.5 fL (ref 80.0–100.0)
Platelets: 139 10*3/uL — ABNORMAL LOW (ref 150–400)
RBC: 3.07 MIL/uL — ABNORMAL LOW (ref 4.22–5.81)
RDW: 14.5 % (ref 11.5–15.5)
WBC: 10.1 10*3/uL (ref 4.0–10.5)
nRBC: 0 % (ref 0.0–0.2)

## 2022-09-29 LAB — TYPE AND SCREEN: Antibody Screen: NEGATIVE

## 2022-09-29 LAB — BASIC METABOLIC PANEL
Anion gap: 10 (ref 5–15)
BUN: 9 mg/dL (ref 6–20)
CO2: 24 mmol/L (ref 22–32)
Calcium: 8.2 mg/dL — ABNORMAL LOW (ref 8.9–10.3)
Chloride: 106 mmol/L (ref 98–111)
Creatinine, Ser: 0.86 mg/dL (ref 0.61–1.24)
GFR, Estimated: >60 mL/min (ref >60)
Glucose, Bld: 100 mg/dL — ABNORMAL HIGH (ref 70–99)
Potassium: 3.6 mmol/L (ref 3.5–5.1)
Sodium: 140 mmol/L (ref 135–145)

## 2022-09-29 LAB — BPAM RBC: Unit Type and Rh: 6200

## 2022-09-29 LAB — SURGICAL PCR SCREEN
MRSA, PCR: NEGATIVE
Staphylococcus aureus: NEGATIVE

## 2022-09-29 LAB — LIPID PANEL
Cholesterol: 112 mg/dL (ref 0–200)
HDL: 40 mg/dL — ABNORMAL LOW (ref >40)
LDL Cholesterol: 63 mg/dL (ref 0–99)
Total CHOL/HDL Ratio: 2.8 RATIO
Triglycerides: 45 mg/dL (ref <150)
VLDL: 9 mg/dL (ref 0–40)
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
## 2022-09-29 NOTE — Progress Notes (Addendum)
Mobility Specialist Progress Note:   09/29/22 1653  Mobility  Activity Ambulated independently in hallway  Level of Assistance Modified independent, requires aide device or extra time  Assistive Device Front wheel walker  Distance Ambulated (ft) 400 ft  Activity Response Tolerated well  Mobility Referral Yes  $Mobility charge 1 Mobility  Mobility Specialist Start Time (ACUTE ONLY) 1635  Mobility Specialist Stop Time (ACUTE ONLY) 1655  Mobility Specialist Time Calculation (min) (ACUTE ONLY) 20 min    During Mobility: 77 HR , 99% SpO2 Post Mobility: 81 HR   Pt received in chair, agreeable to mobility. C/o slight LLE soreness, otherwise asymptomatic throughout. Pt returned to chair with call bell near and all needs met.   Leory Plowman  Mobility Specialist Please contact via [employer] office at [phone]
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
## 2022-09-29 NOTE — Progress Notes (Signed)
  Daily Progress Note  S/p:1 Day Post-Op   Subjective: Left superficial femoral artery interposition bypass graft using reversed, right-sided greater saphenous vein Left superficial femoral artery, popliteal artery embolectomy Posterior medial compartment release of the thigh 4 compartment fasciotomy of the calf  Objective: Vitals:   09/28/22 2335 09/29/22 0335  BP: 117/64 110/61  Pulse: 80 77  Resp: 19 18  Temp: 98.2 F (36.8 C) 98.7 F (37.1 C)  SpO2: 100% 97%    Physical Examination Room Air Regular rate Palpable pulses in the foot - PT and DP Numbness at the toes - stated this has been present since admission  ASSESSMENT/PLAN:  S/p L SFA interposition bypass using rGSV  Doing well overall.  OOB today - PT, OT  Plan for fasciotomy closure tomorrow  NPO midnight    Fara Olden MD MS [facility] [phone] 09/29/2022  8:01 AM

## 2022-09-30 ENCOUNTER — Inpatient Hospital Stay (HOSPITAL_COMMUNITY): Payer: Medicaid Other | Admitting: Anesthesiology

## 2022-09-30 ENCOUNTER — Encounter (HOSPITAL_COMMUNITY): Payer: Self-pay

## 2022-09-30 ENCOUNTER — Encounter (HOSPITAL_COMMUNITY)
Admission: EM | Disposition: A | Discharge status: Home / Self Care | Payer: Self-pay | Source: Home / Self Care | Attending: Vascular Surgery

## 2022-09-30 DIAGNOSIS — I743 Embolism and thrombosis of arteries of the lower extremities: Secondary | ICD-10-CM | POA: Diagnosis not present

## 2022-09-30 DIAGNOSIS — S81802A Unspecified open wound, left lower leg, initial encounter: Secondary | ICD-10-CM | POA: Diagnosis not present

## 2022-09-30 HISTORY — PX: FASCIOTOMY CLOSURE: SHX5829

## 2022-09-30 LAB — URINALYSIS, ROUTINE W REFLEX MICROSCOPIC
Bilirubin Urine: NEGATIVE
Glucose, UA: NEGATIVE mg/dL
Hgb urine dipstick: NEGATIVE
Ketones, ur: NEGATIVE mg/dL
Leukocytes,Ua: NEGATIVE
Nitrite: NEGATIVE
Protein, ur: NEGATIVE mg/dL
Specific Gravity, Urine: 1.003 — ABNORMAL LOW (ref 1.005–1.030)
pH: 7 (ref 5.0–8.0)

## 2022-09-30 SURGERY — FASCIOTOMY CLOSURE
Anesthesia: General | Laterality: Left

## 2022-09-30 MED ORDER — 0.9 % SODIUM CHLORIDE (POUR BTL) OPTIME
TOPICAL | Status: DC | PRN
  Administered 2022-09-30: 1000 mL

## 2022-09-30 MED ORDER — ONDANSETRON HCL 4 MG/2ML IJ SOLN
INTRAMUSCULAR | Status: AC
  Filled 2022-09-30: qty 2

## 2022-09-30 MED ORDER — HYDROMORPHONE HCL 1 MG/ML IJ SOLN
0.2500 mg | INTRAMUSCULAR | Status: DC | PRN
  Administered 2022-09-30: 0.5 mg via INTRAVENOUS

## 2022-09-30 MED ORDER — MIDAZOLAM HCL 2 MG/2ML IJ SOLN
INTRAMUSCULAR | Status: AC
  Filled 2022-09-30: qty 2

## 2022-09-30 MED ORDER — PROMETHAZINE HCL 25 MG/ML IJ SOLN: 6.2500 mg | INTRAMUSCULAR | Status: DC | PRN

## 2022-09-30 MED ORDER — FENTANYL CITRATE (PF) 250 MCG/5ML IJ SOLN
INTRAMUSCULAR | Status: AC
  Filled 2022-09-30: qty 5

## 2022-09-30 MED ORDER — LACTATED RINGERS IV SOLN: INTRAVENOUS | Status: DC

## 2022-09-30 MED ORDER — MIDAZOLAM HCL 2 MG/2ML IJ SOLN
INTRAMUSCULAR | Status: DC | PRN
  Administered 2022-09-30: 2 mg via INTRAVENOUS

## 2022-09-30 MED ORDER — ROCURONIUM BROMIDE 10 MG/ML (PF) SYRINGE
PREFILLED_SYRINGE | INTRAVENOUS | Status: AC
  Filled 2022-09-30: qty 10

## 2022-09-30 MED ORDER — PROPOFOL 10 MG/ML IV BOLUS
INTRAVENOUS | Status: DC | PRN
  Administered 2022-09-30: 30 mg via INTRAVENOUS
  Administered 2022-09-30: 200 mg via INTRAVENOUS

## 2022-09-30 MED ORDER — PROPOFOL 10 MG/ML IV BOLUS
INTRAVENOUS | Status: AC
  Filled 2022-09-30: qty 20

## 2022-09-30 MED ORDER — DEXAMETHASONE SODIUM PHOSPHATE 10 MG/ML IJ SOLN
INTRAMUSCULAR | Status: AC
  Filled 2022-09-30: qty 1

## 2022-09-30 MED ORDER — ORAL CARE MOUTH RINSE: 15.0000 mL | Freq: Once | OROMUCOSAL | Status: AC

## 2022-09-30 MED ORDER — MEPERIDINE HCL 25 MG/ML IJ SOLN: 6.2500 mg | INTRAMUSCULAR | Status: DC | PRN

## 2022-09-30 MED ORDER — ROCURONIUM BROMIDE 10 MG/ML (PF) SYRINGE
PREFILLED_SYRINGE | INTRAVENOUS | Status: DC | PRN
  Administered 2022-09-30: 50 mg via INTRAVENOUS

## 2022-09-30 MED ORDER — CEFAZOLIN SODIUM-DEXTROSE 2-3 GM-%(50ML) IV SOLR
INTRAVENOUS | Status: DC | PRN
  Administered 2022-09-30: 2 g via INTRAVENOUS

## 2022-09-30 MED ORDER — HYDROMORPHONE HCL 1 MG/ML IJ SOLN
INTRAMUSCULAR | Status: AC
  Filled 2022-09-30: qty 1

## 2022-09-30 MED ORDER — AMISULPRIDE (ANTIEMETIC) 5 MG/2ML IV SOLN: 10.0000 mg | Freq: Once | INTRAVENOUS | Status: DC | PRN

## 2022-09-30 MED ORDER — LIDOCAINE 2% (20 MG/ML) 5 ML SYRINGE
INTRAMUSCULAR | Status: DC | PRN
  Administered 2022-09-30: 100 mg via INTRAVENOUS

## 2022-09-30 MED ORDER — DEXAMETHASONE SODIUM PHOSPHATE 10 MG/ML IJ SOLN
INTRAMUSCULAR | Status: DC | PRN
  Administered 2022-09-30: 5 mg via INTRAVENOUS

## 2022-09-30 MED ORDER — FENTANYL CITRATE (PF) 250 MCG/5ML IJ SOLN
INTRAMUSCULAR | Status: DC | PRN
  Administered 2022-09-30: 100 ug via INTRAVENOUS

## 2022-09-30 MED ORDER — OXYCODONE-ACETAMINOPHEN 5-325 MG PO TABS
ORAL_TABLET | ORAL | Status: AC
  Filled 2022-09-30: qty 2

## 2022-09-30 MED ORDER — CHLORHEXIDINE GLUCONATE 0.12 % MT SOLN
15.0000 mL | Freq: Once | OROMUCOSAL | Status: AC
  Administered 2022-09-30: 15 mL via OROMUCOSAL
  Filled 2022-09-30: qty 15

## 2022-09-30 MED ORDER — LIDOCAINE 2% (20 MG/ML) 5 ML SYRINGE
INTRAMUSCULAR | Status: AC
  Filled 2022-09-30: qty 5

## 2022-09-30 MED ORDER — SUGAMMADEX SODIUM 200 MG/2ML IV SOLN
INTRAVENOUS | Status: DC | PRN
  Administered 2022-09-30: 200 mg via INTRAVENOUS

## 2022-09-30 SURGICAL SUPPLY — 31 items
BAG COUNTER SPONGE SURGICOUNT (BAG) ×1 IMPLANT
BAG SPNG CNTER NS LX DISP (BAG) ×1
BNDG CMPR 5X62 HK CLSR LF (GAUZE/BANDAGES/DRESSINGS) ×1
BNDG CMPR 6 X 5 YARDS HK CLSR (GAUZE/BANDAGES/DRESSINGS) ×1
BNDG CMPR 6"X 5 YARDS HK CLSR (GAUZE/BANDAGES/DRESSINGS) ×1
BNDG ELASTIC 6INX 5YD STR LF (GAUZE/BANDAGES/DRESSINGS) IMPLANT
CANISTER SUCT 3000ML PPV (MISCELLANEOUS) ×1 IMPLANT
DRSG OPSITE POSTOP 4X12 (GAUZE/BANDAGES/DRESSINGS) IMPLANT
ELECT REM PT RETURN 9FT ADLT (ELECTROSURGICAL) ×1
ELECTRODE REM PT RTRN 9FT ADLT (ELECTROSURGICAL) ×1 IMPLANT
GAUZE PAD ABD 8X10 STRL (GAUZE/BANDAGES/DRESSINGS) IMPLANT
GAUZE SPONGE 4X4 12PLY STRL (GAUZE/BANDAGES/DRESSINGS) ×1 IMPLANT
GAUZE XEROFORM 5X9 LF (GAUZE/BANDAGES/DRESSINGS) IMPLANT
GLOVE BIOGEL PI IND STRL 8 (GLOVE) ×1 IMPLANT
GOWN STRL REUS W/ TWL LRG LVL3 (GOWN DISPOSABLE) ×3 IMPLANT
GOWN STRL REUS W/TWL 2XL LVL3 (GOWN DISPOSABLE) ×2 IMPLANT
GOWN STRL REUS W/TWL LRG LVL3 (GOWN DISPOSABLE) ×1
KIT BASIN OR (CUSTOM PROCEDURE TRAY) ×1 IMPLANT
KIT TURNOVER KIT B (KITS) ×1 IMPLANT
NS IRRIG 1000ML POUR BTL (IV SOLUTION) ×1 IMPLANT
PACK GENERAL/GYN (CUSTOM PROCEDURE TRAY) ×1 IMPLANT
PACK UNIVERSAL I (CUSTOM PROCEDURE TRAY) ×1 IMPLANT
PAD ARMBOARD 7.5X6 YLW CONV (MISCELLANEOUS) ×2 IMPLANT
STAPLER VISISTAT 35W (STAPLE) IMPLANT
SUT ETHILON 2 0 PSLX (SUTURE) IMPLANT
SUT MNCRL AB 4-0 PS2 18 (SUTURE) IMPLANT
SUT VIC AB 2-0 CTX 36 (SUTURE) IMPLANT
SUT VIC AB 3-0 SH 27 (SUTURE)
SUT VIC AB 3-0 SH 27X BRD (SUTURE) IMPLANT
TOWEL GREEN STERILE (TOWEL DISPOSABLE) ×1 IMPLANT
WATER STERILE IRR 1000ML POUR (IV SOLUTION) ×1 IMPLANT
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
## 2022-09-30 NOTE — Progress Notes (Signed)
  Daily Progress Note  S/p:2 Days Post-Op   Subjective: Left superficial femoral artery interposition bypass graft using reversed, right-sided greater saphenous vein Left superficial femoral artery, popliteal artery embolectomy Posterior medial compartment release of the thigh 4 compartment fasciotomy of the calf  Objective: Vitals:   09/30/22 0528 09/30/22 0809  BP: 119/62 123/79  Pulse: 72 78  Resp: 16 17  Temp: 98.3 F (36.8 C) 98.1 F (36.7 C)  SpO2: 100% 100%    Physical Examination Room Air Regular rate Palpable pulses in the foot - PT and DP Numbness at the toes - stated this has been present since admission  ASSESSMENT/PLAN:  S/p L SFA interposition bypass using rGSV  Plan for fasciotomy closure today. After discussing the risk and benefits, Mark Keller elected to proceed.    Fara Olden MD MS [facility] [phone] 09/30/2022  8:23 AM

## 2022-09-30 NOTE — Progress Notes (Signed)
     NPO Consent signed plan for fasciotomy closure today  Mosetta Pigeon PA-C

## 2022-09-30 NOTE — Anesthesia Postprocedure Evaluation (Signed)
Anesthesia Post Note  Patient: Mark Keller  Procedure(s) Performed: FASCIOTOMY CLOSURE (Left)     Patient location during evaluation: PACU Anesthesia Type: General Level of consciousness: awake Pain management: pain level controlled Vital Signs Assessment: post-procedure vital signs reviewed and stable Respiratory status: spontaneous breathing, nonlabored ventilation and respiratory function stable Cardiovascular status: blood pressure returned to baseline and stable Postop Assessment: no apparent nausea or vomiting Anesthetic complications: no   No notable events documented.  Last Vitals:  Vitals:   09/30/22 1230 09/30/22 1245  BP: 129/72 127/69  Pulse: 84 75  Resp: 13 18  Temp:  36.5 C  SpO2: 99% 99%    Last Pain:  Vitals:   09/30/22 1245  TempSrc:   PainSc: Asleep                 Linton Rump
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
## 2022-09-30 NOTE — Transfer of Care (Signed)
Immediate Anesthesia Transfer of Care Note  Patient: Mark Keller  Procedure(s) Performed: FASCIOTOMY CLOSURE (Left)  Patient Location: PACU  Anesthesia Type:General  Level of Consciousness: awake, alert , and drowsy  Airway & Oxygen Therapy: Patient Spontanous Breathing  Post-op Assessment: Report given to RN, Post -op Vital signs reviewed and stable, and Patient moving all extremities X 4  Post vital signs: Reviewed and stable  Last Vitals:  Vitals Value Taken Time  BP 129/72   Temp    Pulse 90   Resp 16   SpO2 100     Last Pain:  Vitals:   09/30/22 0846  TempSrc:   PainSc: 2       Patients Stated Pain Goal: 2 (09/30/22 0527)  Complications: No notable events documented.

## 2022-09-30 NOTE — Anesthesia Procedure Notes (Signed)
Procedure Name: Intubation Date/Time: 09/30/2022 11:28 AM  Performed by: Aundria Rud, CRNAPre-anesthesia Checklist: Patient identified, Emergency Drugs available, Suction available and Patient being monitored Patient Re-evaluated:Patient Re-evaluated prior to induction Oxygen Delivery Method: Circle System Utilized Preoxygenation: Pre-oxygenation with 100% oxygen Induction Type: IV induction Ventilation: Mask ventilation without difficulty Laryngoscope Size: Mac and 4 Grade View: Grade I Tube type: Oral Tube size: 7.5 mm Number of attempts: 1 Airway Equipment and Method: Stylet and Oral airway Placement Confirmation: ETT inserted through vocal cords under direct vision, positive ETCO2 and breath sounds checked- equal and bilateral Secured at: 22 cm Tube secured with: Tape Dental Injury: Teeth and Oropharynx as per pre-operative assessment

## 2022-10-01 ENCOUNTER — Other Ambulatory Visit: Payer: Self-pay

## 2022-10-01 ENCOUNTER — Encounter (HOSPITAL_COMMUNITY): Payer: Self-pay | Admitting: Vascular Surgery

## 2022-10-01 MED ORDER — ASPIRIN 81 MG PO TBEC: 81.0000 mg | DELAYED_RELEASE_TABLET | Freq: Every day | ORAL | Status: AC

## 2022-10-01 MED ORDER — OXYCODONE-ACETAMINOPHEN 5-325 MG PO TABS: 1.0000 | ORAL_TABLET | ORAL | 0 refills | Status: AC | PRN

## 2022-10-01 NOTE — Progress Notes (Signed)
[facility]  Subjective  - No new complaints   Objective 113/65 82 98.6 F (37 C) (Oral) 18 100%  Intake/Output Summary (Last 24 hours) at 10/01/2022 0708 Last data filed at 10/01/2022 0348 Gross per 24 hour  Intake 1540 ml  Output 3625 ml  Net -2085 ml   Palpable pulses in the foot - PT and DP  Compartments soft Heart RR Lungs non labored breathing   Assessment/Planning: POD # 3  Left superficial femoral artery interposition bypass graft using reversed, right-sided greater saphenous vein Left superficial femoral artery, popliteal artery embolectomy Posterior medial compartment release of the thigh 4 compartment fasciotomy of the calf  POD# 1  4 compartment fasciotomy closure Dr. Karin Lieu was pleased with the muscle viability and elected to close the fasciotomy.  Ted hose to assist with edema to be worn daily thigh high You may remove the left lower leg dressing tomorrow.  Walk and alternate elevation. F/U will be arrange in 2-3 weeks for staple/suture removal    Mark Keller 10/01/2022 7:08 AM --  Laboratory Lab Results: Recent Labs    09/29/22 0017  WBC 10.1  HGB 9.7*  HCT 28.1*  PLT 139*   BMET Recent Labs    09/29/22 0017  NA 140  K 3.6  CL 106  CO2 24  GLUCOSE 100*  BUN 9  CREATININE 0.86  CALCIUM 8.2*    COAG Lab Results  Component Value Date   INR 1.0 09/28/2022   No results found for: "PTT"
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
## 2022-10-01 NOTE — Progress Notes (Addendum)
Mobility Specialist Progress Note:   10/01/22 1208  Mobility  Activity Ambulated independently in hallway  Level of Assistance Independent  Assistive Device None  Distance Ambulated (ft) 370 ft  LLE Weight Bearing WBAT  Activity Response Tolerated well  Mobility Referral Yes  $Mobility charge 1 Mobility  Mobility Specialist Start Time (ACUTE ONLY) 1155  Mobility Specialist Stop Time (ACUTE ONLY) 1203  Mobility Specialist Time Calculation (min) (ACUTE ONLY) 8 min    Pt received ambulating in room independently, agreeable to mobility. C/o numbness in L heel, otherwise asymptomatic throughout. Pt left in room with family and RN present.  Leory Plowman  Mobility Specialist Please contact via [employer] office at [phone]

## 2022-10-02 LAB — TYPE AND SCREEN
ABO/RH(D): A POS
Unit division: 0
Unit division: 0

## 2022-10-02 LAB — BPAM RBC
Blood Product Expiration Date: 202407292359
Blood Product Expiration Date: 202407292359
ISSUE DATE / TIME: 202407090504
ISSUE DATE / TIME: 202407090504
Unit Type and Rh: 6200
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
## 2022-10-08 DIAGNOSIS — Z0279 Encounter for issue of other medical certificate: Secondary | ICD-10-CM

## 2022-10-18 ENCOUNTER — Encounter: Payer: Self-pay | Admitting: *Deleted

## 2022-10-18 ENCOUNTER — Emergency Department
Admission: EM | Admit: 2022-10-18 | Discharge: 2022-10-18 | Disposition: A | Discharge status: Home / Self Care | Payer: Medicaid Other | Attending: Emergency Medicine | Admitting: Emergency Medicine

## 2022-10-18 ENCOUNTER — Other Ambulatory Visit: Payer: Self-pay

## 2022-10-18 DIAGNOSIS — T783XXA Angioneurotic edema, initial encounter: Secondary | ICD-10-CM

## 2022-10-18 DIAGNOSIS — R220 Localized swelling, mass and lump, head: Secondary | ICD-10-CM | POA: Diagnosis present

## 2022-10-18 MED ORDER — ACETAMINOPHEN 500 MG PO TABS
1000.0000 mg | ORAL_TABLET | Freq: Once | ORAL | Status: AC
  Administered 2022-10-18: 1000 mg via ORAL
  Filled 2022-10-18: qty 2

## 2022-10-18 MED ORDER — DEXAMETHASONE 10 MG/ML FOR PEDIATRIC ORAL USE
10.0000 mg | Freq: Once | INTRAMUSCULAR | Status: AC
  Administered 2022-10-18: 10 mg via ORAL
  Filled 2022-10-18 (×2): qty 1

## 2022-10-18 MED ORDER — EPINEPHRINE 0.3 MG/0.3ML IJ SOAJ: 0.3000 mg | Freq: Once | INTRAMUSCULAR | 0 refills | Status: AC

## 2022-10-18 NOTE — ED Triage Notes (Signed)
Right lower lip swelling, onset about 1 hour ago, took 25mg  benadryl for the same. No difficulty swallowing/mouth swelling, breathing or rashes. Has lower lip piercing, but it is not new. No known allergies.

## 2022-10-18 NOTE — Discharge Instructions (Signed)
Please take 1 tablet of over-the-counter cetirizine (Zyrtec) either once or twice a day for the next several days.  This should help your lip swelling improved.  We also gave you a dose of a medication called Decadron which should also help because it is a steroid and will help with inflammation and swelling.  Return immediately to the emergency department if you develop new or worsening symptoms.  We provided you with a prescription for EpiPens which you should keep with you and use at home if you develop substantial swelling that makes it hard for you to breathe.  After using the EpiPen according to label instructions, you need to come immediately to the emergency department, preferably by calling 911.

## 2022-10-18 NOTE — ED Provider Notes (Signed)
[facility] Provider Note    Event Date/Time   First MD Initiated Contact with Patient 10/18/22 [phone]     (approximate)   History   Oral Swelling   HPI Mark Keller is a 32 y.o. male who presents for evaluation of swelling to the right side of his lower lip.  He said that it started all of a sudden early this morning while he was watching TV.  He states that very late so it was about 3:00 in the morning and he was watching a show and he realized that his bottom right lip was starting to feel tight.  He checked it and the right half of his lower lip is swollen and firm.  It does not cross midline and the swelling does not extend into his mouth or face.  No injury of which she was aware.  No recent fever, nausea, vomiting, chest pain.  He said that he has no allergies of which he is aware.  He has multiple facial/tongue piercings but said that he has had them for years and has never had a problem.  No dental issues of which he is aware.  He did not come in contact with any new foods, is not taking any new medications, no new pets, etc.     Physical Exam   Triage Vital Signs: ED Triage Vitals  Encounter Vitals Group     BP 10/18/22 0350 129/85     Systolic BP Percentile --      Diastolic BP Percentile --      Pulse Rate 10/18/22 0350 72     Resp 10/18/22 0350 16     Temp 10/18/22 0350 98.3 F (36.8 C)     Temp Source 10/18/22 0350 Oral     SpO2 10/18/22 0350 100 %     Weight --      Height --      Head Circumference --      Peak Flow --      Pain Score 10/18/22 0350 0     Pain Loc --      Pain Education --      Exclude from Growth Chart --     Most recent vital signs: Vitals:   10/18/22 0350 10/18/22 0712  BP: 129/85   Pulse: 72 68  Resp: 16 15  Temp: 98.3 F (36.8 C)   SpO2: 100% 100%    General: Awake, no distress.  Face:  Patient has edema of the right side of the lower lip, soft and not particularly indurated, no erythema, no  tenderness to palpation.  Edema does not extend beyond the right side of the lower lip.  Area beneath the tongue is supple and soft with no evidence of Ludwig's angina.  No swelling in the neck.  No difficulty swallowing or tolerating his own secretions. CV:  Good peripheral perfusion.  Regular rate and rhythm Resp:  Normal effort. Speaking easily and comfortably, no accessory muscle usage nor intercostal retractions.   Abd:  No distention.    ED Results / Procedures / Treatments   Labs (all labs ordered are listed, but only abnormal results are displayed) Labs Reviewed - No data to display     PROCEDURES:  Critical Care performed: No  Procedures    IMPRESSION / MDM / ASSESSMENT AND PLAN / ED COURSE  I reviewed the triage vital signs and the nursing notes.  Differential diagnosis includes, but is not limited to, nonspecific or idiopathic angioedema, hereditary angioedema, medication or drug side effect, infectious process such as cellulitis, odontogenic infection.  Patient's presentation is most consistent with acute, uncomplicated illness.  Labs/studies ordered:   Interventions/Medications given:  Medications  dexamethasone (DECADRON) 10 MG/ML injection for Pediatric ORAL use 10 mg (10 mg Oral Given 10/18/22 0710)  acetaminophen (TYLENOL) tablet 1,000 mg (1,000 mg Oral Given 10/18/22 0710)    (Note:  hospital course my include additional interventions and/or labs/studies not listed above.)   Patient has been stable for hours since he first noticed the acute onset swelling.  It is not progressed.  He took Benadryl which usually helps when this happens (he disclosed that this has happened multiple times but it is usually milder and lasts for only a short period of time.)  No impingement of airway nor esophagus.  I ordered a dose of Decadron and explained to him the reason.  I also encouraged continued use of Zyrtec/cetirizine once he goes home at  least for the next few days.  The patient incidentally asked if he could have some pain medicine for his left lower leg wound.  He recently had vascular surgery and still has staples in place.  I ordered 1000 mg of acetaminophen and encouraged him to follow-up as an outpatient for that issue as well.  There is no indication he needs additional treatment or intervention or evaluation and I gave my usual and customary return precautions.  I also provided a prescription for EpiPen's should he have a much more complicated situation while he is at home.         FINAL CLINICAL IMPRESSION(S) / ED DIAGNOSES   Final diagnoses:  Angioedema, initial encounter     Rx / DC Orders   ED Discharge Orders          Ordered    Ambulatory Referral to Primary Care (Establish Care)        10/18/22 0631    EPINEPHrine (EPIPEN 2-PAK) 0.3 mg/0.3 mL IJ SOAJ injection   Once        10/18/22 1610             Note:  This document was prepared using Dragon voice recognition software and may include unintentional dictation errors.   Loleta Rose, MD 10/18/22 0830

## 2022-10-20 ENCOUNTER — Telehealth: Payer: Self-pay

## 2022-10-20 ENCOUNTER — Other Ambulatory Visit: Payer: Self-pay

## 2022-10-20 ENCOUNTER — Emergency Department
Admission: EM | Admit: 2022-10-20 | Discharge: 2022-10-20 | Disposition: A | Discharge status: Home / Self Care | Payer: Medicaid Other | Attending: Emergency Medicine | Admitting: Emergency Medicine

## 2022-10-20 DIAGNOSIS — H5712 Ocular pain, left eye: Secondary | ICD-10-CM | POA: Diagnosis not present

## 2022-10-20 DIAGNOSIS — H5711 Ocular pain, right eye: Secondary | ICD-10-CM | POA: Diagnosis not present

## 2022-10-20 DIAGNOSIS — H02841 Edema of right upper eyelid: Secondary | ICD-10-CM

## 2022-10-20 DIAGNOSIS — R519 Headache, unspecified: Secondary | ICD-10-CM | POA: Insufficient documentation

## 2022-10-20 MED ORDER — PREDNISONE 50 MG PO TABS: ORAL_TABLET | ORAL | 0 refills | Status: DC

## 2022-10-20 MED ORDER — ACETAMINOPHEN 325 MG PO TABS
650.0000 mg | ORAL_TABLET | Freq: Once | ORAL | Status: AC
  Administered 2022-10-20: 650 mg via ORAL
  Filled 2022-10-20: qty 2

## 2022-10-20 MED ORDER — PREDNISONE 20 MG PO TABS
50.0000 mg | ORAL_TABLET | Freq: Once | ORAL | Status: AC
  Administered 2022-10-20: 50 mg via ORAL
  Filled 2022-10-20: qty 3

## 2022-10-20 NOTE — ED Provider Notes (Signed)
[facility] Provider Note    Event Date/Time   First MD Initiated Contact with Patient 10/20/22 2112     (approximate)   History   Eye Problem and Headache   HPI  Mark Keller is a 32 y.o. male s/p of a fasciotomy who presents for evaluation of right eye swelling and a headache.  Patient has been taking aspirin for DVT prophylaxis as he recently had leg surgery.  He noticed that his eye began swelling after taking his aspirin earlier today.  He states that a few days ago he was seen by this ED for lip swelling which also occurred after taking the aspirin.  He also mentions a few days before that he noticed some swelling on his wrist after taking the aspirin.  He took Benadryl and Zyrtec PTA.  He reports some blurred vision due to increased tearing of the right eye.  He has a mild right-sided headache as well.      Physical Exam   Triage Vital Signs: ED Triage Vitals  Encounter Vitals Group     BP 10/20/22 2052 (!) 153/95     Systolic BP Percentile --      Diastolic BP Percentile --      Pulse Rate 10/20/22 2052 90     Resp 10/20/22 2052 18     Temp 10/20/22 2052 99.5 F (37.5 C)     Temp Source 10/20/22 2052 Oral     SpO2 10/20/22 2052 99 %     Weight 10/20/22 2055 150 lb (68 kg)     Height 10/20/22 2055 5\' 10"  (1.778 m)     Head Circumference --      Peak Flow --      Pain Score 10/20/22 2055 7     Pain Loc --      Pain Education --      Exclude from Growth Chart --     Most recent vital signs: Vitals:   10/20/22 2052  BP: (!) 153/95  Pulse: 90  Resp: 18  Temp: 99.5 F (37.5 C)  SpO2: 99%    General: Awake, no distress.  CV:  Good peripheral perfusion. Resp:  Normal effort.  Speaking in full sentences. Abd:  No distention.  Other:  Right eyelid is puffy, no overlying erythema or warmth, no purulent drainage, no scleral injection.  No lip swelling.   ED Results / Procedures / Treatments   Labs (all labs ordered are listed,  but only abnormal results are displayed) Labs Reviewed - No data to display   PROCEDURES:  Critical Care performed: No  Procedures   MEDICATIONS ORDERED IN ED: Medications  predniSONE (DELTASONE) tablet 50 mg (50 mg Oral Given 10/20/22 2207)  acetaminophen (TYLENOL) tablet 650 mg (650 mg Oral Given 10/20/22 2207)     IMPRESSION / MDM / ASSESSMENT AND PLAN / ED COURSE  I reviewed the triage vital signs and the nursing notes.                              Differential diagnosis includes, but is not limited to, allergic reaction, orbital cellulitis, preseptal cellulitis.  Patient's presentation is most consistent with acute, uncomplicated illness.  Patient is not presenting with signs consistent with an orbital cellulitis or a preseptal cellulitis, I believe the eye swelling is secondary to an allergic reaction to the aspirin.  He was advised by his primary care to hold the  aspirin.  I agree with this treatment plan.  He was given oral prednisone and I will send him home with a few days as well.  I will explained signs of DVT for him to look for given that he is not to be taking the aspirin anymore. I explained what to look for in terms of an anaphylactic reaction.  He does have an EpiPen at home.  Patient is to follow-up with his primary care provider.  He voiced understanding, all questions were answered and he was stable at discharge.     FINAL CLINICAL IMPRESSION(S) / ED DIAGNOSES   Final diagnoses:  Pain and swelling of upper eyelid of right eye     Rx / DC Orders   ED Discharge Orders          Ordered    predniSONE (DELTASONE) 50 MG tablet        10/20/22 2235             Note:  This document was prepared using Dragon voice recognition software and may include unintentional dictation errors.   Cameron Ali, PA-C 10/20/22 2238    Sharyn Creamer, MD 10/23/22 [phone]

## 2022-10-20 NOTE — Discharge Instructions (Addendum)
I have prescribed prednisone, this is a steroid that will help with your allergic reaction.  Take 1 tablet/day for 5 days.  Stop taking the aspirin as I believe this is causing your symptoms.  Keep an eye out for signs of a blood clot in your leg including warmth, swelling, redness and pain.  Please return to the ED if you develop any of the symptoms.  I have attached information about anaphylaxis, keep an eye out for any of the symptoms please return to the ED if you develop any.

## 2022-10-20 NOTE — Telephone Encounter (Signed)
Pt called stating that he had an allergic reaction and needed advice.  Reviewed pt's chart, returned call for clarification, two identifiers used. Pt has a knot on his wrist appear a few days ago which went away on its own. His lip swelled on 7/29 and he went to the ED and was treated. He noted eye swelling today approx 1 hour after taking his ASA 81 mg. He states no new foods, environmental factors, or meds, except the ASA. Pt is taking Benadryl and Zyrtec. Pt also has epi-pens for any emergent reactions.  Spoke with [employer], PA who advised stopping the ASA and discussing on Friday's office visit. Called pt and relayed PA's advice. Confirmed understanding.

## 2022-10-20 NOTE — ED Notes (Signed)
During visual acuity test, pt reports having surgery "on my retina in my left eye so it's not that great."

## 2022-10-20 NOTE — ED Triage Notes (Addendum)
Pt to ED via POV c/o right eye swelling that started around 11am. Pt says it happened after he took some aspirin. Took benadryl and zyrtec after. Pt denies cp, sob, fevers. Pt complaining of headache around temple that started around 1pm today. Pt says he doesn't have a hx of migraines. Pt having blurry vision in r eye from swelling

## 2022-10-21 DIAGNOSIS — Z419 Encounter for procedure for purposes other than remedying health state, unspecified: Secondary | ICD-10-CM | POA: Diagnosis not present

## 2022-10-22 ENCOUNTER — Ambulatory Visit (INDEPENDENT_AMBULATORY_CARE_PROVIDER_SITE_OTHER): Payer: Medicaid Other | Admitting: Physician Assistant

## 2022-10-22 VITALS — BP 124/78 | HR 89 | Temp 98.7°F | Resp 18 | Ht 70.0 in | Wt 151.0 lb

## 2022-10-22 DIAGNOSIS — S75002D Unspecified injury of femoral artery, left leg, subsequent encounter: Secondary | ICD-10-CM

## 2022-10-22 DIAGNOSIS — W3400XA Accidental discharge from unspecified firearms or gun, initial encounter: Secondary | ICD-10-CM
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
## 2022-10-26 ENCOUNTER — Emergency Department: Payer: Medicaid Other

## 2022-10-26 ENCOUNTER — Other Ambulatory Visit: Payer: Self-pay

## 2022-10-26 ENCOUNTER — Encounter: Payer: Self-pay | Admitting: Physician Assistant

## 2022-10-26 ENCOUNTER — Emergency Department
Admission: EM | Admit: 2022-10-26 | Discharge: 2022-10-27 | Disposition: A | Discharge status: Home / Self Care | Payer: Medicaid Other | Attending: Emergency Medicine | Admitting: Emergency Medicine

## 2022-10-26 ENCOUNTER — Encounter: Payer: Self-pay | Admitting: *Deleted

## 2022-10-26 ENCOUNTER — Telehealth: Payer: Self-pay

## 2022-10-26 DIAGNOSIS — R0602 Shortness of breath: Secondary | ICD-10-CM | POA: Diagnosis not present

## 2022-10-26 DIAGNOSIS — M79605 Pain in left leg: Secondary | ICD-10-CM | POA: Diagnosis not present

## 2022-10-26 DIAGNOSIS — R2240 Localized swelling, mass and lump, unspecified lower limb: Secondary | ICD-10-CM | POA: Diagnosis not present

## 2022-10-26 DIAGNOSIS — R9431 Abnormal electrocardiogram [ECG] [EKG]: Secondary | ICD-10-CM | POA: Diagnosis not present

## 2022-10-26 DIAGNOSIS — R600 Localized edema: Secondary | ICD-10-CM | POA: Insufficient documentation

## 2022-10-26 DIAGNOSIS — M7989 Other specified soft tissue disorders: Secondary | ICD-10-CM

## 2022-10-26 DIAGNOSIS — R079 Chest pain, unspecified: Secondary | ICD-10-CM | POA: Diagnosis not present

## 2022-10-26 LAB — BASIC METABOLIC PANEL
Anion gap: 8 (ref 5–15)
BUN: 15 mg/dL (ref 6–20)
CO2: 27 mmol/L (ref 22–32)
Calcium: 8.7 mg/dL — ABNORMAL LOW (ref 8.9–10.3)
Chloride: 102 mmol/L (ref 98–111)
Creatinine, Ser: 0.97 mg/dL (ref 0.61–1.24)
GFR, Estimated: >60 mL/min (ref >60)
Glucose, Bld: 85 mg/dL (ref 70–99)
Potassium: 3.9 mmol/L (ref 3.5–5.1)
Sodium: 137 mmol/L (ref 135–145)

## 2022-10-26 LAB — CBC
HCT: 43.3 % (ref 39.0–52.0)
Hemoglobin: 14.1 g/dL (ref 13.0–17.0)
MCH: 31.8 pg (ref 26.0–34.0)
MCHC: 32.6 g/dL (ref 30.0–36.0)
MCV: 97.5 fL (ref 80.0–100.0)
Platelets: 203 10*3/uL (ref 150–400)
RBC: 4.44 MIL/uL (ref 4.22–5.81)
RDW: 14.5 % (ref 11.5–15.5)
WBC: 8.4 10*3/uL (ref 4.0–10.5)
nRBC: 0 % (ref 0.0–0.2)

## 2022-10-26 LAB — TROPONIN I (HIGH SENSITIVITY): Troponin I (High Sensitivity): 4 ng/L (ref <18)

## 2022-10-26 MED ORDER — CLOPIDOGREL BISULFATE 75 MG PO TABS: 75.0000 mg | ORAL_TABLET | Freq: Every day | ORAL | 3 refills | Status: AC
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
## 2022-10-26 NOTE — Addendum Note (Signed)
Addended by: Graceann Congress on: 10/26/2022 09:06 AM   Modules accepted: Orders

## 2022-10-26 NOTE — ED Triage Notes (Addendum)
Pt reports he feels swelling from his  left foot all the way up to his thigh, having a hard time walking because of it. Some SOB, associated with chest pain, lasting up to 10 minutes today. Pt reporting he had his first dose of Plavix today. No rashes noted, good pulses in extremity, does have some numbness, but unchanged since his left leg fasciotomy surgery.

## 2022-10-26 NOTE — Telephone Encounter (Signed)
Pt called to clarify what rx was sent in today by our office. He had not realized they were ordering Plavix, as discussed during his office visit. Per APP note, he can resume normal activity, without restrictions. He asked about this as well and verbalized understanding. No further question/concerns at this time.

## 2022-10-27 ENCOUNTER — Telehealth: Payer: Self-pay

## 2022-10-27 NOTE — Discharge Instructions (Signed)
Your testing in the emergency department did not show any emergency conditions like blood clot.  The fluid collection on your ultrasound is unlikely to represent an abscess or infection at this time, as we discussed.  Follow-up with your surgeon as planned.  Thank you for choosing [place] for your health care today!  Please see your primary doctor this week for a follow up appointment.   If you have any new, worsening, or unexpected symptoms call your doctor right away or come back to the emergency department for reevaluation.

## 2022-10-27 NOTE — Telephone Encounter (Signed)
Pt called requesting assistance with his new prescription.  Reviewed pt's chart, returned call for clarification, two identifiers used. Pt started Plavix yesterday and subsequently went to the ED with some lower extremity swelling. He had not taken Plavix today until he spoke with this office. Instructed him to continue the Plavix and monitor for any other reactions that may be possibly attributed to it. If he has any swelling in the facial area, he will immediately go to the ED. Will call our office with update soon. Confirmed understanding.

## 2022-10-27 NOTE — ED Provider Notes (Signed)
Patient received in signout.  Pending DVT ultrasound. Appears well and comfortable.  Negative for DVT.  Fluid collection unlikely to represent abscess as there is no overlying infectious changes to the area, no fluctuance, no purulence per patient report, no fever, no leukocytosis. Plan will be for discharge at this time, continue medications, follow-up with surgeon.  Return if any worsening.   Pilar Jarvis, MD 10/27/22 [phone]

## 2022-11-01 ENCOUNTER — Telehealth: Payer: Self-pay

## 2022-11-01 NOTE — Telephone Encounter (Signed)
Pt called stating that he had another allergic reaction to his med.  Reviewed pt's chart, returned call for clarification, two identifiers used. Pt stated that he had facial swelling after taking Plavix a couple of days ago, treated it with Benadryl and did not have to go to the ED. Pt also asked for a return to work note with no restrictions. 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  Spoke with PA's in clinic. Msg sent to Dr. Karin Lieu who advised that pt not take the Plavix and there is not a suitable alternative for the pt at this time.  Called pt and informed him of Dr. Karin Lieu' advice. Work note faxed. Confirmed understanding.

## 2022-11-05 ENCOUNTER — Emergency Department
Admission: EM | Admit: 2022-11-05 | Discharge: 2022-11-05 | Disposition: A | Discharge status: Home / Self Care | Payer: Medicaid Other | Attending: Emergency Medicine | Admitting: Emergency Medicine

## 2022-11-05 ENCOUNTER — Other Ambulatory Visit: Payer: Self-pay

## 2022-11-05 DIAGNOSIS — T783XXA Angioneurotic edema, initial encounter: Secondary | ICD-10-CM | POA: Diagnosis not present

## 2022-11-05 DIAGNOSIS — R220 Localized swelling, mass and lump, head: Secondary | ICD-10-CM | POA: Diagnosis present

## 2022-11-05 MED ORDER — PREDNISONE 20 MG PO TABS: 40.0000 mg | ORAL_TABLET | Freq: Every day | ORAL | 0 refills | Status: AC

## 2022-11-05 MED ORDER — PREDNISONE 20 MG PO TABS
60.0000 mg | ORAL_TABLET | Freq: Once | ORAL | Status: AC
  Administered 2022-11-05: 60 mg via ORAL
  Filled 2022-11-05: qty 3

## 2022-11-05 NOTE — ED Provider Notes (Signed)
[facility] Provider Note    Event Date/Time   First MD Initiated Contact with Patient 11/05/22 [phone]     (approximate)  History   Chief Complaint: Upper lip swelling  HPI  Mark Keller is a 32 y.o. male with no significant past medical history who presents to the emergency department for upper lip swelling.  He states since last night he has noticed some mild swelling to the upper lip.  Patient states this has happened multiple times over the last several months that usually resolves with Benadryl or prednisone.  Patient took Benadryl last night and again this morning but did not notice any significant decrease in the swelling so he came to the emergency department.  Patient denies any swelling of his tongue or throat denies any trouble breathing or swallowing.  No known allergies or exposures.  Physical Exam   Triage Vital Signs: ED Triage Vitals  Encounter Vitals Group     BP 11/05/22 0727 129/82     Systolic BP Percentile --      Diastolic BP Percentile --      Pulse Rate 11/05/22 0726 86     Resp 11/05/22 0726 15     Temp 11/05/22 0726 98.4 F (36.9 C)     Temp Source 11/05/22 0726 Oral     SpO2 11/05/22 0726 100 %     Weight 11/05/22 0727 150 lb 12.7 oz (68.4 kg)     Height 11/05/22 0727 5\' 10"  (1.778 m)     Head Circumference --      Peak Flow --      Pain Score 11/05/22 0727 0     Pain Loc --      Pain Education --      Exclude from Growth Chart --     Most recent vital signs: Vitals:   11/05/22 0726 11/05/22 0727  BP:  129/82  Pulse: 86   Resp: 15   Temp: 98.4 F (36.9 C)   SpO2: 100%     General: Awake, no distress.  CV:  Good peripheral perfusion.  Resp:  Normal effort.  Equal breath sounds bilaterally.  Abd:  No distention.   Other:  Patient is slight swelling of the upper lip appears to be bilateral.  No erythema no signs of cellulitis.  Widely patent pharynx with no swelling noted.  Normal-appearing tongue.   ED  Results / Procedures / Treatments   MEDICATIONS ORDERED IN ED: Medications  predniSONE (DELTASONE) tablet 60 mg (has no administration in time range)     IMPRESSION / MDM / ASSESSMENT AND PLAN / ED COURSE  I reviewed the triage vital signs and the nursing notes.  Patient's presentation is most consistent with acute presentation with potential threat to life or bodily function.  Patient presents to the emergency department for mild swelling of the upper lip.  Minimal swelling noted on exam.  No other swelling noted.  No hives or rash.  Suspect this could be angioedema, could also be allergy related.  Given minimal symptoms as the patient states this has resolved with prednisone in the past we will discharge with 5 days of prednisone.  I have put a referral into the ENT/allergist for further evaluation and likely allergy testing.  Patient agreeable to plan of care.  FINAL CLINICAL IMPRESSION(S) / ED DIAGNOSES   Angioedema  Rx / DC Orders   Prednisone  Note:  This document was prepared using Dragon voice recognition software and may include  unintentional dictation errors.   Minna Antis, MD 11/05/22 [phone]

## 2022-11-05 NOTE — Discharge Instructions (Signed)
Please call the number provided for ENT/Allergy to set up an allergy test.  Take your presdnisone as prescribed. Return to the ER for any increase in swelling or any involvement of your throat/tongue.

## 2022-11-05 NOTE — ED Triage Notes (Signed)
Upper lip swelling.  Onset last night at 2100.  States took benadryl last night and then again this morning at around 630.  Trace upper lip swelling noted.  Voice clear and strong NAD

## 2022-11-15 DIAGNOSIS — J3089 Other allergic rhinitis: Secondary | ICD-10-CM | POA: Diagnosis not present

## 2022-11-16 DIAGNOSIS — Z889 Allergy status to unspecified drugs, medicaments and biological substances status: Secondary | ICD-10-CM | POA: Diagnosis not present

## 2022-11-21 DIAGNOSIS — Z419 Encounter for procedure for purposes other than remedying health state, unspecified: Secondary | ICD-10-CM | POA: Diagnosis not present

## 2022-12-21 DIAGNOSIS — Z419 Encounter for procedure for purposes other than remedying health state, unspecified: Secondary | ICD-10-CM | POA: Diagnosis not present

## 2023-01-06 ENCOUNTER — Encounter: Payer: Self-pay | Admitting: Emergency Medicine

## 2023-01-06 ENCOUNTER — Other Ambulatory Visit: Payer: Self-pay

## 2023-01-06 ENCOUNTER — Emergency Department
Admission: EM | Admit: 2023-01-06 | Discharge: 2023-01-06 | Disposition: A | Discharge status: Home / Self Care | Payer: Medicaid Other | Attending: Emergency Medicine | Admitting: Emergency Medicine

## 2023-01-06 ENCOUNTER — Emergency Department: Payer: Medicaid Other

## 2023-01-06 DIAGNOSIS — M79605 Pain in left leg: Secondary | ICD-10-CM

## 2023-01-06 DIAGNOSIS — L0390 Cellulitis, unspecified: Secondary | ICD-10-CM

## 2023-01-06 DIAGNOSIS — M7989 Other specified soft tissue disorders: Secondary | ICD-10-CM | POA: Diagnosis not present

## 2023-01-06 DIAGNOSIS — L03116 Cellulitis of left lower limb: Secondary | ICD-10-CM | POA: Insufficient documentation

## 2023-01-06 MED ORDER — SULFAMETHOXAZOLE-TRIMETHOPRIM 800-160 MG PO TABS: 1.0000 | ORAL_TABLET | Freq: Two times a day (BID) | ORAL | 0 refills | Status: AC

## 2023-01-06 MED ORDER — SULFAMETHOXAZOLE-TRIMETHOPRIM 800-160 MG PO TABS
1.0000 | ORAL_TABLET | Freq: Once | ORAL | Status: AC
  Administered 2023-01-06: 1 via ORAL
  Filled 2023-01-06: qty 1
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
## 2023-01-06 NOTE — Discharge Instructions (Signed)
Please seek medical attention for any high fevers, chest pain, shortness of breath, change in behavior, persistent vomiting, bloody stool or any other new or concerning symptoms.  
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
## 2023-01-07 ENCOUNTER — Telehealth: Payer: Self-pay

## 2023-01-07 DIAGNOSIS — W3400XA Accidental discharge from unspecified firearms or gun, initial encounter: Secondary | ICD-10-CM

## 2023-01-07 DIAGNOSIS — S75002D Unspecified injury of femoral artery, left leg, subsequent encounter: Secondary | ICD-10-CM

## 2023-01-07 NOTE — Telephone Encounter (Signed)
Pt called stating that he'd gone to the ED last night and they recommended a vascular f/u.  Reviewed pt's chart, returned call for clarification, two identifiers used. DVT r/o'd, given ABX for infection. According to last OV note, the plan was a 3 mo f/u with ABI and LLE [facility] [place], however the d/c sheet was marked PRN.  Considering his symptoms and for peace of mind, pt was scheduled based on last OV note plan. Pt will be moving at the beginning of November, so appt scheduled for first available. Confirmed understanding.

## 2023-01-08 NOTE — Plan of Care (Signed)
[facility] Tonsillectomy/Adenoidectomy, Postoperative PEDS care plan entered in error.

## 2023-01-09 ENCOUNTER — Other Ambulatory Visit: Payer: Self-pay

## 2023-01-09 ENCOUNTER — Telehealth: Payer: Self-pay | Admitting: Emergency Medicine

## 2023-01-09 ENCOUNTER — Emergency Department: Payer: Medicaid Other

## 2023-01-09 ENCOUNTER — Emergency Department
Admission: EM | Admit: 2023-01-09 | Discharge: 2023-01-09 | Discharge status: Against Medical Advice | Payer: Medicaid Other | Attending: Emergency Medicine | Admitting: Emergency Medicine

## 2023-01-09 DIAGNOSIS — J180 Bronchopneumonia, unspecified organism: Secondary | ICD-10-CM | POA: Diagnosis not present

## 2023-01-09 DIAGNOSIS — R0789 Other chest pain: Secondary | ICD-10-CM | POA: Insufficient documentation

## 2023-01-09 DIAGNOSIS — Z5321 Procedure and treatment not carried out due to patient leaving prior to being seen by health care provider: Secondary | ICD-10-CM | POA: Insufficient documentation

## 2023-01-09 DIAGNOSIS — R42 Dizziness and giddiness: Secondary | ICD-10-CM | POA: Diagnosis not present

## 2023-01-09 DIAGNOSIS — R079 Chest pain, unspecified: Secondary | ICD-10-CM | POA: Diagnosis not present

## 2023-01-09 HISTORY — DX: Cellulitis, unspecified: L03.90

## 2023-01-09 LAB — CBC
HCT: 45 % (ref 39.0–52.0)
Hemoglobin: 16.1 g/dL (ref 13.0–17.0)
MCH: 31.4 pg (ref 26.0–34.0)
MCHC: 35.8 g/dL (ref 30.0–36.0)
MCV: 87.9 fL (ref 80.0–100.0)
Platelets: 155 10*3/uL (ref 150–400)
RBC: 5.12 MIL/uL (ref 4.22–5.81)
RDW: 12.1 % (ref 11.5–15.5)
WBC: 6.8 10*3/uL (ref 4.0–10.5)
nRBC: 0 % (ref 0.0–0.2)

## 2023-01-09 LAB — TROPONIN I (HIGH SENSITIVITY): Troponin I (High Sensitivity): 3 ng/L (ref <18)

## 2023-01-09 LAB — BASIC METABOLIC PANEL
Anion gap: 9 (ref 5–15)
BUN: 18 mg/dL (ref 6–20)
CO2: 22 mmol/L (ref 22–32)
Calcium: 8.6 mg/dL — ABNORMAL LOW (ref 8.9–10.3)
Chloride: 101 mmol/L (ref 98–111)
Creatinine, Ser: 1.01 mg/dL (ref 0.61–1.24)
GFR, Estimated: >60 mL/min (ref >60)
Glucose, Bld: 114 mg/dL — ABNORMAL HIGH (ref 70–99)
Potassium: 3.9 mmol/L (ref 3.5–5.1)
Sodium: 132 mmol/L — ABNORMAL LOW (ref 135–145)

## 2023-01-09 MED ORDER — ALBUTEROL SULFATE (2.5 MG/3ML) 0.083% IN NEBU: 2.5000 mg | INHALATION_SOLUTION | RESPIRATORY_TRACT | Status: DC | PRN

## 2023-01-09 NOTE — ED Triage Notes (Signed)
Pt called from [facility] to treatment room, no response. Pt called from different areas of waiting room, no response

## 2023-01-09 NOTE — ED Notes (Signed)
Pt called x's 3, no response ?

## 2023-01-09 NOTE — ED Triage Notes (Signed)
Pt called from [facility] to treatment room, no response 

## 2023-01-09 NOTE — ED Triage Notes (Signed)
Pt called x's 3, no repsonse

## 2023-01-09 NOTE — Telephone Encounter (Signed)
This RN conferred with Dr. Larinda Buttery regarding X-ray results, per Dr. Larinda Buttery patient needs to return to be evaluated and prescribed abx. Attempted to call patient, left HIPAA compliant voicemail requesting return phone call.
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
## 2023-01-10 ENCOUNTER — Emergency Department (HOSPITAL_COMMUNITY)
Admission: EM | Admit: 2023-01-10 | Discharge: 2023-01-11 | Disposition: A | Discharge status: Home / Self Care | Payer: Medicaid Other | Attending: Emergency Medicine | Admitting: Emergency Medicine

## 2023-01-10 DIAGNOSIS — Z7901 Long term (current) use of anticoagulants: Secondary | ICD-10-CM | POA: Diagnosis not present

## 2023-01-10 DIAGNOSIS — Z20822 Contact with and (suspected) exposure to covid-19: Secondary | ICD-10-CM | POA: Diagnosis not present

## 2023-01-10 DIAGNOSIS — R0602 Shortness of breath: Secondary | ICD-10-CM | POA: Diagnosis not present

## 2023-01-10 DIAGNOSIS — J168 Pneumonia due to other specified infectious organisms: Secondary | ICD-10-CM | POA: Diagnosis not present

## 2023-01-10 DIAGNOSIS — J189 Pneumonia, unspecified organism: Secondary | ICD-10-CM

## 2023-01-10 DIAGNOSIS — R5383 Other fatigue: Secondary | ICD-10-CM | POA: Diagnosis not present

## 2023-01-10 DIAGNOSIS — R059 Cough, unspecified: Secondary | ICD-10-CM | POA: Diagnosis not present

## 2023-01-10 NOTE — ED Triage Notes (Signed)
Pt to ED via [facility] from [facility]. Pt c/o generalized cough, PNA like symptoms x a few days. Pt seen at [facility] ED for same yesterday and DC with dx of PNA. Pt c/o generalized weakness, cough, SOB on exertion that began to increase today. Lung sounds clear. Pt started on ABX and denies any missed doses. Pt c/o rib and chest pain while coughing.   EMS: 136/90 88 HR 99% RA 18 RR 111 CBG

## 2023-01-10 NOTE — ED Notes (Signed)
Pt ambulated in hallway oxygen sats 97-100% on room air No SHOB noted on exertion.

## 2023-01-11 ENCOUNTER — Other Ambulatory Visit: Payer: Self-pay

## 2023-01-11 ENCOUNTER — Emergency Department (HOSPITAL_COMMUNITY): Payer: Medicaid Other

## 2023-01-11 DIAGNOSIS — R0602 Shortness of breath: Secondary | ICD-10-CM | POA: Diagnosis not present

## 2023-01-11 LAB — RESP PANEL BY RT-PCR (RSV, FLU A&B, COVID)  RVPGX2
Influenza A by PCR: NEGATIVE
Influenza B by PCR: NEGATIVE
Resp Syncytial Virus by PCR: NEGATIVE
SARS Coronavirus 2 by RT PCR: NEGATIVE

## 2023-01-11 LAB — BASIC METABOLIC PANEL
Anion gap: 10 (ref 5–15)
BUN: 9 mg/dL (ref 6–20)
CO2: 21 mmol/L — ABNORMAL LOW (ref 22–32)
Calcium: 8.6 mg/dL — ABNORMAL LOW (ref 8.9–10.3)
Chloride: 103 mmol/L (ref 98–111)
Creatinine, Ser: 0.84 mg/dL (ref 0.61–1.24)
GFR, Estimated: >60 mL/min (ref >60)
Glucose, Bld: 114 mg/dL — ABNORMAL HIGH (ref 70–99)
Potassium: 4.2 mmol/L (ref 3.5–5.1)
Sodium: 134 mmol/L — ABNORMAL LOW (ref 135–145)

## 2023-01-11 LAB — CBC WITH DIFFERENTIAL/PLATELET
Abs Immature Granulocytes: 0.01 10*3/uL (ref 0.00–0.07)
Basophils Absolute: 0 10*3/uL (ref 0.0–0.1)
Basophils Relative: 0 %
Eosinophils Absolute: 0.2 10*3/uL (ref 0.0–0.5)
Eosinophils Relative: 5 %
HCT: 44.5 % (ref 39.0–52.0)
Hemoglobin: 15.5 g/dL (ref 13.0–17.0)
Immature Granulocytes: 0 %
Lymphocytes Relative: 29 %
Lymphs Abs: 1.5 10*3/uL (ref 0.7–4.0)
MCH: 30.6 pg (ref 26.0–34.0)
MCHC: 34.8 g/dL (ref 30.0–36.0)
MCV: 87.8 fL (ref 80.0–100.0)
Monocytes Absolute: 0.9 10*3/uL (ref 0.1–1.0)
Monocytes Relative: 16 %
Neutro Abs: 2.6 10*3/uL (ref 1.7–7.7)
Neutrophils Relative %: 50 %
Platelets: 144 10*3/uL — ABNORMAL LOW (ref 150–400)
RBC: 5.07 MIL/uL (ref 4.22–5.81)
RDW: 12.4 % (ref 11.5–15.5)
WBC: 5.3 10*3/uL (ref 4.0–10.5)
nRBC: 0 % (ref 0.0–0.2)

## 2023-01-11 LAB — D-DIMER, QUANTITATIVE: D-Dimer, Quant: 0.48 ug{FEU}/mL (ref 0.00–0.50)

## 2023-01-11 MED ORDER — ALBUTEROL SULFATE HFA 108 (90 BASE) MCG/ACT IN AERS
1.0000 | INHALATION_SPRAY | Freq: Once | RESPIRATORY_TRACT | Status: AC
  Administered 2023-01-11: 1 via RESPIRATORY_TRACT
  Filled 2023-01-11: qty 6.7

## 2023-01-11 NOTE — Discharge Instructions (Signed)
Please continue to take your antibiotics for your pneumonia.  If you develop shortness of breath or other life-threatening symptoms please return to the emergency department.

## 2023-01-11 NOTE — ED Provider Notes (Signed)
[place] EMERGENCY DEPARTMENT AT [facility] Provider Note   CSN: [id number] Arrival date & time: 01/10/23  1853     History  Chief Complaint  Patient presents with   Shortness of Breath    Mark Keller is a 32 y.o. male.  Patient with diagnosis of right-sided pneumonia yesterday presents to the emergency department via EMS complaining of cough and continued discomfort in the right side of his chest.  Patient was prescribed Levaquin and Bactrim yesterday for the same.  He endorses vague shortness of breath but is able to ambulate and speak without difficulty.  He denies abdominal pain, nausea, vomiting.   Shortness of Breath      Home Medications Prior to Admission medications   Medication Sig Start Date End Date Taking? Authorizing Provider  aspirin EC 81 MG tablet Take 1 tablet (81 mg total) by mouth daily at 6 (six) AM. Swallow whole. Patient not taking: Reported on 10/22/2022 10/02/22   Lars Mage, PA-C  clopidogrel (PLAVIX) 75 MG tablet Take 1 tablet (75 mg total) by mouth daily. 10/26/22 10/26/23  Baglia, Corrina, PA-C  oxyCODONE-acetaminophen (PERCOCET/ROXICET) 5-325 MG tablet Take 1 tablet by mouth every 4 (four) hours as needed for moderate pain. 10/01/22   Lars Mage, PA-C  sulfamethoxazole-trimethoprim (BACTRIM DS) 800-160 MG tablet Take 1 tablet by mouth 2 (two) times daily for 10 days. 01/06/23 01/16/23  Phineas Semen, MD      Allergies    Morphine    Review of Systems   Review of Systems  Respiratory:  Positive for shortness of breath.     Physical Exam Updated Vital Signs BP (!) 131/99   Pulse 71   Temp 98.5 F (36.9 C)   Resp 18   SpO2 100%  Physical Exam Vitals and nursing note reviewed.  Constitutional:      General: He is not in acute distress.    Appearance: He is well-developed.  HENT:     Head: Normocephalic and atraumatic.  Eyes:     Conjunctiva/sclera: Conjunctivae normal.  Cardiovascular:     Rate and Rhythm:  Normal rate and regular rhythm.     Heart sounds: No murmur heard. Pulmonary:     Effort: Pulmonary effort is normal. No respiratory distress.     Breath sounds: Normal breath sounds.  Abdominal:     Palpations: Abdomen is soft.     Tenderness: There is no abdominal tenderness.  Musculoskeletal:        General: No swelling.     Cervical back: Neck supple.  Skin:    General: Skin is warm and dry.     Capillary Refill: Capillary refill takes less than 2 seconds.  Neurological:     Mental Status: He is alert.  Psychiatric:        Mood and Affect: Mood normal.     ED Results / Procedures / Treatments   Labs (all labs ordered are listed, but only abnormal results are displayed) Labs Reviewed  BASIC METABOLIC PANEL - Abnormal; Notable for the following components:      Result Value   Sodium 134 (*)    CO2 21 (*)    Glucose, Bld 114 (*)    Calcium 8.6 (*)    All other components within normal limits  CBC WITH DIFFERENTIAL/PLATELET - Abnormal; Notable for the following components:   Platelets 144 (*)    All other components within normal limits  RESP PANEL BY RT-PCR (RSV, FLU A&B, COVID)  RVPGX2  D-DIMER, QUANTITATIVE    EKG None  Radiology DG Chest 2 View  Result Date: 01/11/2023 CLINICAL DATA:  Shortness of breath EXAM: CHEST - 2 VIEW COMPARISON:  01/09/2023 FINDINGS: Cardiac shadow is within normal limits. Slight increase in the degree of lingular pneumonia is noted when compare with the prior exam. No pleural effusion is seen. No bony abnormality is noted. IMPRESSION: Slight worsening in lingular pneumonia. Electronically Signed   By: Alcide Clever M.D.   On: 01/11/2023 01:32   DG Chest 2 View  Result Date: 01/09/2023 CLINICAL DATA:  32 year old male with history of chest pain. EXAM: CHEST - 2 VIEW COMPARISON:  Chest x-ray 10/26/2022. FINDINGS: Ill-defined area of interstitial prominence and airspace consolidation noted in the region of the inferior aspect of the  lingula. Right lung is clear. No pleural effusions. No pneumothorax. No evidence of pulmonary edema. Heart size is normal. Upper mediastinal contours are within normal limits. IMPRESSION: 1. Bronchopneumonia in the lingula. Followup PA and lateral chest X-ray is recommended in 3-4 weeks following trial of antibiotic therapy to ensure resolution and exclude underlying malignancy. Electronically Signed   By: Trudie Reed M.D.   On: 01/09/2023 06:55    Procedures Procedures    Medications Ordered in ED Medications  albuterol (VENTOLIN HFA) 108 (90 Base) MCG/ACT inhaler 1 puff (1 puff Inhalation Given 01/11/23 0303)    ED Course/ Medical Decision Making/ A&P                                 Medical Decision Making Amount and/or Complexity of Data Reviewed Labs: ordered. Radiology: ordered.   This patient presents to the ED for concern of chest pain and cough, this involves an extensive number of treatment options, and is a complaint that carries with it a high risk of complications and morbidity.  The differential diagnosis includes pneumonia, ACS, PE, others   Co morbidities that complicate the patient evaluation  Pneumonia   Additional history obtained:  Additional history obtained from EMS External records from outside source obtained and reviewed including urgent care notes from yesterday along with chest x-ray from yesterday   Lab Tests:  I Ordered, and personally interpreted labs.  The pertinent results include: Grossly unremarkable CBC, BMP, D-dimer, respiratory panel   Imaging Studies ordered:  I ordered imaging studies including chest x-ray I independently visualized and interpreted imaging which showed slight worsening in lingular pneumonia I agree with the radiologist interpretation   Cardiac Monitoring: / EKG:  The patient was maintained on a cardiac monitor.  I personally viewed and interpreted the cardiac monitored which showed an underlying rhythm of:  Sinus rhythm   Social Determinants of Health:  Patient has Medicaid for his primary health insurance type   Test / Admission - Considered:  Patient with pneumonia repeated on chest x-ray.  D-dimer negative showing no sign of clot burden at this time.  Story not consistent with ACS.  Respiratory panel negative.  Patient has only had 1 day of antibiotic therapy at this time.  It is too early to consider this a failure of antibiotics.  Patient's vitals are all within normal limits.  Room air saturation 100%.  Lungs are clear to auscultation.  Plan to discharge home with recommendation for continued antibiotic therapy at this time.  Patient given strict return precautions including worsening shortness of breath.         Final Clinical Impression(s) /  ED Diagnoses Final diagnoses:  Pneumonia of right lung due to infectious organism, unspecified part of lung    Rx / DC Orders ED Discharge Orders     None         Pamala Duffel 01/11/23 Ned Card, MD 01/11/23 [phone]

## 2023-01-13 ENCOUNTER — Encounter: Payer: Self-pay | Admitting: Vascular Surgery

## 2023-01-13 ENCOUNTER — Ambulatory Visit (INDEPENDENT_AMBULATORY_CARE_PROVIDER_SITE_OTHER): Payer: Medicaid Other | Admitting: Vascular Surgery

## 2023-01-13 ENCOUNTER — Ambulatory Visit (HOSPITAL_COMMUNITY)
Admission: RE | Admit: 2023-01-13 | Discharge: 2023-01-13 | Disposition: A | Discharge status: Home / Self Care | Payer: Medicaid Other | Source: Ambulatory Visit | Attending: Vascular Surgery | Admitting: Vascular Surgery

## 2023-01-13 ENCOUNTER — Ambulatory Visit (INDEPENDENT_AMBULATORY_CARE_PROVIDER_SITE_OTHER)
Admission: RE | Admit: 2023-01-13 | Discharge: 2023-01-13 | Disposition: A | Discharge status: Home / Self Care | Payer: Medicaid Other | Source: Ambulatory Visit | Attending: Vascular Surgery | Admitting: Vascular Surgery

## 2023-01-13 VITALS — BP 128/86 | HR 73 | Temp 98.6°F | Resp 20 | Ht 70.0 in | Wt 147.0 lb

## 2023-01-13 DIAGNOSIS — W3400XA Accidental discharge from unspecified firearms or gun, initial encounter: Secondary | ICD-10-CM

## 2023-01-13 DIAGNOSIS — Z95828 Presence of other vascular implants and grafts: Secondary | ICD-10-CM | POA: Diagnosis not present

## 2023-01-13 DIAGNOSIS — W3400XD Accidental discharge from unspecified firearms or gun, subsequent encounter: Secondary | ICD-10-CM | POA: Diagnosis not present

## 2023-01-13 DIAGNOSIS — S75002D Unspecified injury of femoral artery, left leg, subsequent encounter: Secondary | ICD-10-CM

## 2023-01-13 DIAGNOSIS — F17200 Nicotine dependence, unspecified, uncomplicated: Secondary | ICD-10-CM | POA: Insufficient documentation

## 2023-01-13 LAB — VAS US ABI WITH/WO TBI
Left ABI: 1.08
Right ABI: 1.06
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
## 2023-01-19 ENCOUNTER — Ambulatory Visit: Payer: Medicaid Other | Admitting: Nurse Practitioner

## 2023-01-20 ENCOUNTER — Other Ambulatory Visit (HOSPITAL_COMMUNITY): Payer: Medicaid Other

## 2023-01-20 ENCOUNTER — Encounter (HOSPITAL_COMMUNITY): Payer: Medicaid Other

## 2023-01-20 ENCOUNTER — Ambulatory Visit: Payer: Medicaid Other | Admitting: Vascular Surgery

## 2023-01-21 DIAGNOSIS — Z419 Encounter for procedure for purposes other than remedying health state, unspecified: Secondary | ICD-10-CM | POA: Diagnosis not present

## 2023-02-01 ENCOUNTER — Other Ambulatory Visit: Payer: Self-pay

## 2023-02-01 DIAGNOSIS — W3400XA Accidental discharge from unspecified firearms or gun, initial encounter: Secondary | ICD-10-CM

## 2023-02-20 DIAGNOSIS — Z419 Encounter for procedure for purposes other than remedying health state, unspecified: Secondary | ICD-10-CM | POA: Diagnosis not present

## 2023-03-23 DIAGNOSIS — Z419 Encounter for procedure for purposes other than remedying health state, unspecified: Secondary | ICD-10-CM | POA: Diagnosis not present

## 2023-04-23 DIAGNOSIS — Z419 Encounter for procedure for purposes other than remedying health state, unspecified: Secondary | ICD-10-CM | POA: Diagnosis not present

## 2023-05-21 DIAGNOSIS — Z419 Encounter for procedure for purposes other than remedying health state, unspecified: Secondary | ICD-10-CM | POA: Diagnosis not present

## 2023-07-02 DIAGNOSIS — Z419 Encounter for procedure for purposes other than remedying health state, unspecified: Secondary | ICD-10-CM | POA: Diagnosis not present
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
## 2023-07-21 ENCOUNTER — Ambulatory Visit (HOSPITAL_COMMUNITY): Payer: Medicaid Other

## 2023-07-21 ENCOUNTER — Ambulatory Visit: Payer: Medicaid Other | Admitting: Vascular Surgery

## 2023-07-21 ENCOUNTER — Ambulatory Visit (HOSPITAL_COMMUNITY): Admission: RE | Admit: 2023-07-21 | Payer: Medicaid Other | Source: Ambulatory Visit
